# Patient Record
Sex: Male | Born: 1970 | Race: Black or African American | Hispanic: No | Marital: Married | Smoking: Former smoker
Health system: Southern US, Community
[De-identification: ages and names within clinical notes are randomized; demographics above are authoritative.]

## PROBLEM LIST (undated history)

## (undated) DIAGNOSIS — I509 Heart failure, unspecified: Secondary | ICD-10-CM

## (undated) DIAGNOSIS — I10 Essential (primary) hypertension: Secondary | ICD-10-CM

## (undated) HISTORY — DX: Essential (primary) hypertension: I10

---

## 2010-07-23 ENCOUNTER — Ambulatory Visit: Payer: Self-pay | Admitting: Cardiology

## 2010-07-23 ENCOUNTER — Observation Stay (HOSPITAL_COMMUNITY): Admission: EM | Admit: 2010-07-23 | Discharge: 2010-07-24 | Payer: Self-pay | Admitting: Emergency Medicine

## 2010-07-24 ENCOUNTER — Encounter (INDEPENDENT_AMBULATORY_CARE_PROVIDER_SITE_OTHER): Payer: Self-pay | Admitting: Emergency Medicine

## 2010-11-24 ENCOUNTER — Emergency Department (HOSPITAL_COMMUNITY): Payer: Self-pay

## 2010-11-24 ENCOUNTER — Emergency Department (HOSPITAL_COMMUNITY)
Admission: EM | Admit: 2010-11-24 | Discharge: 2010-11-24 | Disposition: A | Discharge status: Home / Self Care | Payer: Self-pay | Attending: Emergency Medicine | Admitting: Emergency Medicine

## 2010-11-24 DIAGNOSIS — R05 Cough: Secondary | ICD-10-CM | POA: Insufficient documentation

## 2010-11-24 DIAGNOSIS — J40 Bronchitis, not specified as acute or chronic: Secondary | ICD-10-CM | POA: Insufficient documentation

## 2010-11-24 DIAGNOSIS — I10 Essential (primary) hypertension: Secondary | ICD-10-CM | POA: Insufficient documentation

## 2010-11-24 DIAGNOSIS — R062 Wheezing: Secondary | ICD-10-CM | POA: Insufficient documentation

## 2010-11-24 DIAGNOSIS — R0602 Shortness of breath: Secondary | ICD-10-CM | POA: Insufficient documentation

## 2010-11-24 DIAGNOSIS — R059 Cough, unspecified: Secondary | ICD-10-CM | POA: Insufficient documentation

## 2011-01-02 LAB — BASIC METABOLIC PANEL
CO2: 24 mEq/L (ref 19–32)
Chloride: 105 mEq/L (ref 96–112)
GFR calc Af Amer: >60 mL/min (ref >60)
Potassium: 4 mEq/L (ref 3.5–5.1)

## 2011-01-02 LAB — DIFFERENTIAL
Eosinophils Relative: 3 % (ref 0–5)
Lymphocytes Relative: 40 % (ref 12–46)
Lymphs Abs: 2.8 10*3/uL (ref 0.7–4.0)
Monocytes Absolute: 0.5 10*3/uL (ref 0.1–1.0)
Monocytes Relative: 7 % (ref 3–12)

## 2011-01-02 LAB — CBC
HCT: 43.6 % (ref 39.0–52.0)
MCH: 30.8 pg (ref 26.0–34.0)
MCV: 90.6 fL (ref 78.0–100.0)
RBC: 4.81 MIL/uL (ref 4.22–5.81)
WBC: 7 10*3/uL (ref 4.0–10.5)

## 2011-01-02 LAB — POCT CARDIAC MARKERS
CKMB, poc: <1 ng/mL — ABNORMAL LOW (ref 1.0–8.0)
Myoglobin, poc: 90.5 ng/mL (ref 12–200)
Troponin i, poc: <0.05 ng/mL (ref 0.00–0.09)
Troponin i, poc: <0.05 ng/mL (ref 0.00–0.09)
Troponin i, poc: <0.05 ng/mL (ref 0.00–0.09)

## 2011-11-18 ENCOUNTER — Ambulatory Visit (INDEPENDENT_AMBULATORY_CARE_PROVIDER_SITE_OTHER): Payer: Self-pay | Admitting: Family Medicine

## 2011-11-18 VITALS — BP 162/106 | HR 84 | Temp 98.0°F | Resp 18 | Ht 69.75 in | Wt 214.0 lb

## 2011-11-18 DIAGNOSIS — I10 Essential (primary) hypertension: Secondary | ICD-10-CM

## 2011-11-18 DIAGNOSIS — K0889 Other specified disorders of teeth and supporting structures: Secondary | ICD-10-CM

## 2011-11-18 DIAGNOSIS — K089 Disorder of teeth and supporting structures, unspecified: Secondary | ICD-10-CM

## 2011-11-18 DIAGNOSIS — K047 Periapical abscess without sinus: Secondary | ICD-10-CM

## 2011-11-18 DIAGNOSIS — K029 Dental caries, unspecified: Secondary | ICD-10-CM

## 2011-11-18 LAB — POCT CBC
Granulocyte percent: 71.5
HCT, POC: 48.8
Hemoglobin: 15.9
MID (cbc): 5.8 %M — AB (ref 0–0.9)
RBC: 5.21
RDW, POC: 15.3 % — AB (ref 11.6–14.8)

## 2011-11-18 MED ORDER — AMOXICILLIN 875 MG PO TABS: 875.0000 mg | ORAL_TABLET | Freq: Two times a day (BID) | ORAL | Status: AC

## 2011-11-18 MED ORDER — HYDROCODONE-ACETAMINOPHEN 5-325 MG PO TABS: 1.0000 | ORAL_TABLET | Freq: Four times a day (QID) | ORAL | Status: AC | PRN

## 2011-11-18 NOTE — Progress Notes (Signed)
Subjective:     Patient ID: Jeffrey Murray, male   DOB: 18-Sep-1971, 41 y.o.   MRN: [id number]  HPI   Primary Physician: No primary provider on file.  Chief Complaint: Pain in mouth  41 y.o. y/o [place] American male with history noted below presents with complaint of 3 day h/o pain right lower tooth. Patient complains of swelling and pain right first molar. Tooth has had previous cavity filled when the patient was a teenager. Noted swelling of the right cheek. Afebrile/ no chills. No drainage or discharge from tooth or gum. No difficulty speaking or swallowing. No erythema of cheek.  Does not currently have a dentist. No formal dental evaluation.     PMH: HTN - Followed by the [facility] ER.  Prior to Admission medications   Medication Sig Start Date End Date Taking? Authorizing Provider  benazepril-hydrochlorthiazide (LOTENSIN HCT) 20-25 MG per tablet Take 1 tablet by mouth daily.    Historical Provider, MD  cloNIDine (CATAPRES) 0.2 MG tablet Take 0.2 mg by mouth 2 (two) times daily.    Historical Provider, MD  metoprolol tartrate (LOPRESSOR) 25 MG tablet Take 25 mg by mouth 2 (two) times daily.    Historical Provider, MD    No Known Allergies  History   Social History  . Marital Status: Married    Spouse Name: N/A    Number of Children: N/A  . Years of Education: N/A   Social History Main Topics  . Smoking status: Current Some Day Smoker  . Smokeless tobacco: None  . Alcohol Use: None  . Drug Use: None  . Sexually Active: None   Other Topics Concern  . None   Social History Narrative  . None    No family history on file.    Review of Systems  Constitutional: Negative for fever, chills, activity change, appetite change, fatigue and unexpected weight change.  HENT: Positive for facial swelling, mouth sores and dental problem. Negative for sore throat, drooling, trouble swallowing, neck pain, neck stiffness, voice change and postnasal drip.   Respiratory:  Negative for cough.   Cardiovascular: Negative for chest pain.       Objective:   Physical Exam  Vitals reviewed. Constitutional: He is oriented to person, place, and time. He appears well-developed and well-nourished. No distress.  HENT:  Head: Normocephalic and atraumatic.  Right Ear: External ear normal.  Left Ear: External ear normal.  Mouth/Throat: Oropharynx is clear and moist and mucous membranes are normal. He does not have dentures. No oral lesions. Abnormal dentition. Dental caries present. No dental abscesses, uvula swelling or lacerations.    Eyes: Conjunctivae and EOM are normal. Pupils are equal, round, and reactive to light.  Neck: Normal range of motion. Neck supple. No tracheal deviation present. No thyromegaly present.  Cardiovascular: Normal rate, regular rhythm and normal heart sounds.  Exam reveals no gallop and no friction rub.   No murmur heard. Pulmonary/Chest: Effort normal and breath sounds normal. No stridor. No respiratory distress. He has no wheezes. He has no rales. He exhibits no tenderness.  Lymphadenopathy:    He has no cervical adenopathy.  Neurological: He is alert and oriented to person, place, and time.  Skin: Skin is warm and dry. He is not diaphoretic. No erythema.  Psychiatric: He has a normal mood and affect. His behavior is normal. Judgment and thought content normal.    CBC    Component Value Date/Time   WBC 9.3 11/18/2011 1311   WBC 7.0 07/23/2010 1525  RBC 5.21 11/18/2011 1311   RBC 4.81 07/23/2010 1525   HGB 15.9 11/18/2011 1311   HGB 14.8 07/23/2010 1525   HCT 48.8 11/18/2011 1311   HCT 43.6 07/23/2010 1525   PLT 239 07/23/2010 1525   MCV 93.7 11/18/2011 1311   MCV 90.6 07/23/2010 1525   MCH 30.5 11/18/2011 1311   MCH 30.8 07/23/2010 1525   MCHC 32.6 11/18/2011 1311   MCHC 33.9 07/23/2010 1525   RDW 14.4 07/23/2010 1525   LYMPHSABS 2.8 07/23/2010 1525   MONOABS 0.5 07/23/2010 1525   EOSABS 0.2 07/23/2010 1525   BASOSABS 0.0 07/23/2010 1525         Assessment:     41 y.o. African American male with early dental abscess and tooth pain.    Plan:     1. Dental referral. Patient given names and phones numbers of local dentists with instructions to call for an appointment. If he cannot get an appointment within 24 hours or if he worsens he is to go to the ER or RTC for evaluation and treatment.  2. Amoxicillin 875 mg 1 po bid # 20 No RF 3. Norco 5/325 mg 1 po q 6 hours prn pain # 30 No RF SED 4. Recommend patient to take antihypertensives as directed and acquire PCP for follow up and management of his HTN. Follow up 1-2 days for BP. 5. RTC and ER precautions 6. Patient seen by and discussed with Dr. Alwyn Ren.     Patient was seen by me with the the physician assistant. Agree with exam and assessment and treatment.

## 2011-11-18 NOTE — Patient Instructions (Addendum)
Dental Pain  Please call a dentist today for evaluation and treatment of your tooth. If you cannot get into a dentist please come back for evaluation.  A tooth ache may be caused by cavities (tooth decay). Cavities expose the nerve of the tooth to air and hot or cold temperatures. It may come from an infection or abscess (also called a boil or furuncle) around your tooth. It is also often caused by dental caries (tooth decay). This causes the pain you are having. DIAGNOSIS  Your caregiver can diagnose this problem by exam. TREATMENT   If caused by an infection, it may be treated with medications which kill germs (antibiotics) and pain medications as prescribed by your caregiver. Take medications as directed.   Only take over-the-counter or prescription medicines for pain, discomfort, or fever as directed by your caregiver.   Whether the tooth ache today is caused by infection or dental disease, you should see your dentist as soon as possible for further care.  SEEK MEDICAL CARE IF: The exam and treatment you received today has been provided on an emergency basis only. This is not a substitute for complete medical or dental care. If your problem worsens or new problems (symptoms) appear, and you are unable to meet with your dentist, call or return to this location. SEEK IMMEDIATE MEDICAL CARE IF:   You have a fever.   You develop redness and swelling of your face, jaw, or neck.   You are unable to open your mouth.   You have severe pain uncontrolled by pain medicine.  MAKE SURE YOU:   Understand these instructions.   Will watch your condition.   Will get help right away if you are not doing well or get worse.  Document Released: 10/06/2005 Document Revised: 06/18/2011 Document Reviewed: 05/24/2008 [facility] Patient Information 2012 [place], [place].

## 2011-11-19 ENCOUNTER — Telehealth: Payer: Self-pay

## 2011-11-19 NOTE — Telephone Encounter (Signed)
Patient states that the antibiotics and pain medication that he was given yesterday are not helping with the pain and swelling. Please advise.

## 2012-02-11 ENCOUNTER — Emergency Department (HOSPITAL_COMMUNITY): Payer: Medicaid Other

## 2012-02-11 ENCOUNTER — Encounter (HOSPITAL_COMMUNITY): Payer: Self-pay | Admitting: *Deleted

## 2012-02-11 ENCOUNTER — Emergency Department (HOSPITAL_COMMUNITY)
Admission: EM | Admit: 2012-02-11 | Discharge: 2012-02-11 | Disposition: A | Discharge status: Home / Self Care | Payer: Medicaid Other | Attending: Emergency Medicine | Admitting: Emergency Medicine

## 2012-02-11 DIAGNOSIS — F17200 Nicotine dependence, unspecified, uncomplicated: Secondary | ICD-10-CM | POA: Insufficient documentation

## 2012-02-11 DIAGNOSIS — R05 Cough: Secondary | ICD-10-CM | POA: Insufficient documentation

## 2012-02-11 DIAGNOSIS — N289 Disorder of kidney and ureter, unspecified: Secondary | ICD-10-CM | POA: Insufficient documentation

## 2012-02-11 DIAGNOSIS — R059 Cough, unspecified: Secondary | ICD-10-CM | POA: Insufficient documentation

## 2012-02-11 DIAGNOSIS — I509 Heart failure, unspecified: Secondary | ICD-10-CM

## 2012-02-11 DIAGNOSIS — I10 Essential (primary) hypertension: Secondary | ICD-10-CM | POA: Insufficient documentation

## 2012-02-11 DIAGNOSIS — R0602 Shortness of breath: Secondary | ICD-10-CM | POA: Insufficient documentation

## 2012-02-11 LAB — COMPREHENSIVE METABOLIC PANEL
ALT: 41 U/L (ref 0–53)
AST: 41 U/L — ABNORMAL HIGH (ref 0–37)
CO2: 26 mEq/L (ref 19–32)
Calcium: 9.2 mg/dL (ref 8.4–10.5)
GFR calc non Af Amer: 55 mL/min — ABNORMAL LOW (ref >90)
Sodium: 139 mEq/L (ref 135–145)

## 2012-02-11 LAB — PRO B NATRIURETIC PEPTIDE: Pro B Natriuretic peptide (BNP): 3701 pg/mL — ABNORMAL HIGH (ref 0–125)

## 2012-02-11 MED ORDER — FUROSEMIDE 40 MG PO TABS: 40.0000 mg | ORAL_TABLET | Freq: Every day | ORAL | Status: DC

## 2012-02-11 MED ORDER — FUROSEMIDE 20 MG PO TABS
40.0000 mg | ORAL_TABLET | Freq: Once | ORAL | Status: AC
  Administered 2012-02-11: 40 mg via ORAL
  Filled 2012-02-11: qty 2

## 2012-02-11 NOTE — Discharge Instructions (Signed)
Stop smoking.  Your x-ray shows fluid in your lungs, but no pneumonia.  Your oxygen level is normal.  Use Lasix to remove fluid from your lungs and decrease in her shortness of breath.  Followup with your Dr. for reevaluation and to discuss stopping 1 of your other blood pressure medicines since.  I have added a new one.  Return for worse or uncontrolled symptoms

## 2012-02-11 NOTE — ED Provider Notes (Signed)
History     CSN: [id number]  Arrival date & time 02/11/12  4098   First MD Initiated Contact with Patient 02/11/12 1103      Chief Complaint  Patient presents with  . Shortness of Breath    (Consider location/radiation/quality/duration/timing/severity/associated sxs/prior treatment) Patient is a 41 y.o. male presenting with shortness of breath. The history is provided by the patient.  Shortness of Breath  Associated symptoms include cough and shortness of breath. Pertinent negatives include no chest pain and no fever.   the patient is a 41 year old, male, who has hypertension, and smokes about one pack per week.  He presents emergency department complaining of chest congestion and nonproductive cough, dyspnea on exertion, and orthopnea.  He denies chest pain.  He denies nausea, vomiting, fevers, chills, sweating.  He denies leg pain or swelling.  He says it is hard to for him to sleep because  he wakes up short of breath, with a cough.  Past Medical History  Diagnosis Date  . HTN (hypertension)     not taking medications daily    History reviewed. No pertinent past surgical history.  No family history on file.  History  Substance Use Topics  . Smoking status: Current Some Day Smoker  . Smokeless tobacco: Not on file  . Alcohol Use: Yes      Review of Systems  Constitutional: Negative for fever and chills.  HENT: Positive for congestion.   Respiratory: Positive for cough and shortness of breath. Negative for chest tightness.   Cardiovascular: Negative for chest pain and leg swelling.  Gastrointestinal: Negative for nausea and vomiting.  Neurological: Negative for headaches.  All other systems reviewed and are negative.    Allergies  Review of patient's allergies indicates no known allergies.  Home Medications   Current Outpatient Rx  Name Route Sig Dispense Refill  . BENAZEPRIL-HYDROCHLOROTHIAZIDE 20-25 MG PO TABS Oral Take 1 tablet by mouth daily.    Marland Kitchen  CLONIDINE HCL 0.2 MG PO TABS Oral Take 0.2 mg by mouth 2 (two) times daily.    Marland Kitchen METOPROLOL TARTRATE 25 MG PO TABS Oral Take 25 mg by mouth 2 (two) times daily.      BP 170/134  Pulse 87  Temp(Src) 97.7 F (36.5 C) (Oral)  Resp 20  Ht 5\' 10"  (1.778 m)  Wt 220 lb (99.791 kg)  BMI 31.57 kg/m2  SpO2 98%  Physical Exam  Vitals reviewed. Constitutional: He is oriented to person, place, and time. He appears well-developed and well-nourished. No distress.  HENT:  Head: Normocephalic and atraumatic.  Eyes: Conjunctivae are normal.  Neck: Normal range of motion. Neck supple.  Cardiovascular: Normal rate.   No murmur heard. Pulmonary/Chest: No respiratory distress. He has no wheezes. He has no rales.       Bilateral rhonchi  Abdominal: Soft. He exhibits no distension.  Musculoskeletal: Normal range of motion.  Neurological: He is alert and oriented to person, place, and time.  Skin: Skin is warm and dry.  Psychiatric: He has a normal mood and affect. Thought content normal.    ED Course  Procedures (including critical care time) 41 year old, male, with symptoms consistent with heart failure, including nonproductive cough, shortness of breath, dyspnea on exertion, and orthopnea.  He is in no distress.  Chest x-ray shows interstitial edema and he got rhonchi on exam.  He is hypertensive with room air saturation of 98%.  We'll give Lasix in ED and as prescription.  He can follow up with his  Dr. for further evaluation  Labs Reviewed  COMPREHENSIVE METABOLIC PANEL - Abnormal; Notable for the following:    Glucose, Bld 126 (*)    Creatinine, Ser 1.51 (*)    Albumin 3.3 (*)    AST 41 (*)    GFR calc non Af Amer 55 (*)    GFR calc Af Amer 64 (*)    All other components within normal limits  PRO B NATRIURETIC PEPTIDE   Dg Chest 2 View  02/11/2012  *RADIOLOGY REPORT*  Clinical Data: Short of breath.  Weakness.  CHEST - 2 VIEW  Comparison: 11/24/2010  Findings: The heart is enlarged.   Mediastinal shadows are normal. There is venous hypertension with early interstitial edema.  No measurable effusion.  Tiny amount of fluid in the fissures.  No focal lesion.  No significant bony finding.  IMPRESSION: Cardiomegaly.  Pulmonary venous hypertension with early interstitial edema.  Original Report Authenticated By: Thomasenia Sales, M.D.     No diagnosis found.    MDM  Mild CHF, and mild renal insufficiency. No pneumonia, hypoxia, respiratory distress.        Cheri Guppy, MD 02/11/12 1124

## 2012-02-11 NOTE — ED Notes (Signed)
Patient reports when he goes to sleep,  He wakes up not breathing and he has sob.  Patient states he feels like there is something in his chest making him cough.  Patient states when he walks he has sob.

## 2012-03-19 ENCOUNTER — Emergency Department (HOSPITAL_COMMUNITY): Payer: Self-pay

## 2012-03-19 ENCOUNTER — Encounter (HOSPITAL_COMMUNITY): Payer: Self-pay | Admitting: Emergency Medicine

## 2012-03-19 ENCOUNTER — Emergency Department (HOSPITAL_COMMUNITY)
Admission: EM | Admit: 2012-03-19 | Discharge: 2012-03-19 | Disposition: A | Discharge status: Home / Self Care | Payer: Self-pay | Attending: Emergency Medicine | Admitting: Emergency Medicine

## 2012-03-19 DIAGNOSIS — R609 Edema, unspecified: Secondary | ICD-10-CM | POA: Insufficient documentation

## 2012-03-19 DIAGNOSIS — R0989 Other specified symptoms and signs involving the circulatory and respiratory systems: Secondary | ICD-10-CM

## 2012-03-19 DIAGNOSIS — J811 Chronic pulmonary edema: Secondary | ICD-10-CM | POA: Insufficient documentation

## 2012-03-19 DIAGNOSIS — R0602 Shortness of breath: Secondary | ICD-10-CM | POA: Insufficient documentation

## 2012-03-19 DIAGNOSIS — I10 Essential (primary) hypertension: Secondary | ICD-10-CM

## 2012-03-19 DIAGNOSIS — R600 Localized edema: Secondary | ICD-10-CM

## 2012-03-19 LAB — COMPREHENSIVE METABOLIC PANEL
Albumin: 3 g/dL — ABNORMAL LOW (ref 3.5–5.2)
BUN: 13 mg/dL (ref 6–23)
Creatinine, Ser: 1.42 mg/dL — ABNORMAL HIGH (ref 0.50–1.35)
Total Protein: 6 g/dL (ref 6.0–8.3)

## 2012-03-19 LAB — DIFFERENTIAL
Basophils Relative: 1 % (ref 0–1)
Eosinophils Absolute: 0.3 10*3/uL (ref 0.0–0.7)
Eosinophils Relative: 4 % (ref 0–5)
Monocytes Absolute: 0.4 10*3/uL (ref 0.1–1.0)
Monocytes Relative: 6 % (ref 3–12)

## 2012-03-19 LAB — CBC
HCT: 45.2 % (ref 39.0–52.0)
Hemoglobin: 15.3 g/dL (ref 13.0–17.0)
MCH: 31.2 pg (ref 26.0–34.0)
MCHC: 33.8 g/dL (ref 30.0–36.0)

## 2012-03-19 LAB — PRO B NATRIURETIC PEPTIDE: Pro B Natriuretic peptide (BNP): 2723 pg/mL — ABNORMAL HIGH (ref 0–125)

## 2012-03-19 MED ORDER — HYDROCODONE-ACETAMINOPHEN 5-500 MG PO TABS: 1.0000 | ORAL_TABLET | Freq: Four times a day (QID) | ORAL | Status: AC | PRN

## 2012-03-19 MED ORDER — CLONIDINE HCL 0.1 MG PO TABS
0.2000 mg | ORAL_TABLET | Freq: Once | ORAL | Status: AC
  Administered 2012-03-19: 0.2 mg via ORAL
  Filled 2012-03-19: qty 2

## 2012-03-19 MED ORDER — FUROSEMIDE 10 MG/ML IJ SOLN
40.0000 mg | Freq: Once | INTRAMUSCULAR | Status: AC
  Administered 2012-03-19: 40 mg via INTRAVENOUS
  Filled 2012-03-19: qty 4

## 2012-03-19 MED ORDER — FUROSEMIDE 40 MG PO TABS: 40.0000 mg | ORAL_TABLET | Freq: Every day | ORAL | Status: DC

## 2012-03-19 MED ORDER — HYDROCODONE-ACETAMINOPHEN 5-325 MG PO TABS
1.0000 | ORAL_TABLET | Freq: Once | ORAL | Status: AC
  Administered 2012-03-19: 1 via ORAL
  Filled 2012-03-19: qty 1

## 2012-03-19 MED ORDER — CLONIDINE HCL 0.2 MG PO TABS: 0.2000 mg | ORAL_TABLET | Freq: Two times a day (BID) | ORAL | Status: DC

## 2012-03-19 NOTE — ED Provider Notes (Signed)
History     CSN: [id number]  Arrival date & time 03/19/12  1022   First MD Initiated Contact with Patient 03/19/12 1044      Chief Complaint  Patient presents with  . Leg Swelling    (Consider location/radiation/quality/duration/timing/severity/associated sxs/prior treatment) HPI Comments: Pt is a 41yo male that presents with complaint of LE swelling. States swelling started about 2 wks ago. States has never been this severe. Admits to pain as well. States ran out of lasix 2 days ago. Also out of his blood pressure medications. States he does not have a primary care doctor. Reports progressive shorness of breath as well, worse with laying flat and exertion. States he was here about a month ago and was told he had some fluid on his lungs, and was sent home with fluid pills. Pt denies fever, chills, chest pain, abdominal pain. No pain in his calfs. No recent travel or surgeries.  The history is provided by the patient.    Past Medical History  Diagnosis Date  . HTN (hypertension)     not taking medications daily    History reviewed. No pertinent past surgical history.  No family history on file.  History  Substance Use Topics  . Smoking status: Current Some Day Smoker  . Smokeless tobacco: Not on file  . Alcohol Use: Yes      Review of Systems  Constitutional: Negative for fever, chills and diaphoresis.  HENT: Negative.   Respiratory: Positive for shortness of breath. Negative for cough, chest tightness and wheezing.   Cardiovascular: Positive for leg swelling. Negative for chest pain and palpitations.  Gastrointestinal: Negative for abdominal pain.  Genitourinary: Negative for dysuria.  Musculoskeletal: Negative.   Skin: Negative.   Neurological: Negative for dizziness, weakness, light-headedness and headaches.    Allergies  Review of patient's allergies indicates no known allergies.  Home Medications   Current Outpatient Rx  Name Route Sig Dispense Refill  .  FUROSEMIDE 40 MG PO TABS Oral Take 1 tablet (40 mg total) by mouth daily. 30 tablet 0    BP 149/118  Pulse 104  Temp(Src) 98.2 F (36.8 C) (Oral)  Resp 14  SpO2 96%  Physical Exam  Nursing note and vitals reviewed. Constitutional: He is oriented to person, place, and time. He appears well-developed and well-nourished. No distress.  HENT:  Head: Normocephalic.  Eyes: Conjunctivae are normal.  Neck: Neck supple.  Cardiovascular: Normal rate, regular rhythm and normal heart sounds.   Pulmonary/Chest: Effort normal. No respiratory distress. He has no wheezes. He has rales.       Rales at bases bilaterally  Abdominal: Soft. Bowel sounds are normal. He exhibits no distension. There is no tenderness.  Musculoskeletal: He exhibits edema.       3+ bilateral pitting lower extremity edema up to mid shin. Calfs non tender bilaterally. Negative homans's sign.  Neurological: He is alert and oriented to person, place, and time.  Skin: Skin is warm and dry.  Psychiatric: He has a normal mood and affect.    ED Course  Procedures (including critical care time)  Pt with increased bilateral lower leg edema. Last CXR showed cardioemgaly with vascular congestion, Suspect CHF? Will get labs, BNP, CXR. Lasix ordered.   Results for orders placed during the hospital encounter of 03/19/12  CBC      Component Value Range   WBC 6.5  4.0 - 10.5 (K/uL)   RBC 4.91  4.22 - 5.81 (MIL/uL)   Hemoglobin 15.3  13.0 -  17.0 (g/dL)   HCT 16.1  09.6 - 04.5 (%)   MCV 92.1  78.0 - 100.0 (fL)   MCH 31.2  26.0 - 34.0 (pg)   MCHC 33.8  30.0 - 36.0 (g/dL)   RDW 40.9  81.1 - 91.4 (%)   Platelets 240  150 - 400 (K/uL)  DIFFERENTIAL      Component Value Range   Neutrophils Relative 49  43 - 77 (%)   Neutro Abs 3.2  1.7 - 7.7 (K/uL)   Lymphocytes Relative 40  12 - 46 (%)   Lymphs Abs 2.6  0.7 - 4.0 (K/uL)   Monocytes Relative 6  3 - 12 (%)   Monocytes Absolute 0.4  0.1 - 1.0 (K/uL)   Eosinophils Relative 4  0 - 5  (%)   Eosinophils Absolute 0.3  0.0 - 0.7 (K/uL)   Basophils Relative 1  0 - 1 (%)   Basophils Absolute 0.1  0.0 - 0.1 (K/uL)  COMPREHENSIVE METABOLIC PANEL      Component Value Range   Sodium 138  135 - 145 (mEq/L)   Potassium 3.9  3.5 - 5.1 (mEq/L)   Chloride 104  96 - 112 (mEq/L)   CO2 22  19 - 32 (mEq/L)   Glucose, Bld 108 (*) 70 - 99 (mg/dL)   BUN 13  6 - 23 (mg/dL)   Creatinine, Ser 7.82 (*) 0.50 - 1.35 (mg/dL)   Calcium 8.9  8.4 - 95.6 (mg/dL)   Total Protein 6.0  6.0 - 8.3 (g/dL)   Albumin 3.0 (*) 3.5 - 5.2 (g/dL)   AST 48 (*) 0 - 37 (U/L)   ALT 48  0 - 53 (U/L)   Alkaline Phosphatase 158 (*) 39 - 117 (U/L)   Total Bilirubin 0.6  0.3 - 1.2 (mg/dL)   GFR calc non Af Amer 60 (*) >90 (mL/min)   GFR calc Af Amer 69 (*) >90 (mL/min)  PRO B NATRIURETIC PEPTIDE      Component Value Range   Pro B Natriuretic peptide (BNP) 2723.0 (*) 0 - 125 (pg/mL)   Dg Chest 2 View  03/19/2012  *RADIOLOGY REPORT*  Clinical Data: 41 year old male with shortness of breath and lower extremity swelling.  CHEST - 2 VIEW  Comparison: 02/11/2012 and earlier.  Findings: Stable cardiomegaly and mediastinal contours.  Trace fluid in the pleural fissures and continued vascular congestion.  No overt edema.  No pneumothorax or consolidation. Visualized tracheal air column is within normal limits.  No acute osseous abnormality.  IMPRESSION: Stable cardiomegaly and vascular congestion without overt pulmonary edema.  Original Report Authenticated By: Harley Hallmark, M.D.   1:01 PM CXR shows stable cardiomegaly and vascular congestion without pulmonary edema. VS normal. Labs without major findings. Discussed with pt about possibility of admission for diuresis, he chose to go home with prescription for lasix and refill on his clonidine for HTN. Pt was seen by Child psychotherapist, given resources for primary care doctor.   1. Bilateral lower extremity edema   2. Pulmonary vascular congestion   3. Hypertension        MDM         Lottie Mussel, PA 03/19/12 [phone]

## 2012-03-19 NOTE — ED Notes (Signed)
Onset 2 weeks ago lower extremity increasing overtime. Pain 7/10 achy throbbing pain.

## 2012-03-19 NOTE — Discharge Instructions (Signed)
You have extra fluid on your lungs and your legs. This may be related to your heart, it is very important that you follow up with your doctor as soon as able. Take lasix (fluid pill) as prescribed. Take clonidine for your blood pressure. Keep legs elevated. Watch your diet, avoid foods high in sodium. Follow up. Return if worsening.   Edema Edema is an abnormal build-up of fluids in tissues. Because this is partly dependent on gravity (water flows to the lowest place), it is more common in the leg sand thighs (lower extremities). It is also common in the looser tissues, like around the eyes. Painless swelling of the feet and ankles is common and increases as a person ages. It may affect both legs and may include the calves or even thighs. When squeezed, the fluid may move out of the affected area and may leave a dent for a few moments. CAUSES   Prolonged standing or sitting in one place for extended periods of time. Movement helps pump tissue fluid into the veins, and absence of movement prevents this, resulting in edema.   Varicose veins. The valves in the veins do not work as well as they should. This causes fluid to leak into the tissues.   Fluid and salt overload.   Injury, burn, or surgery to the leg, ankle, or foot, may damage veins and allow fluid to leak out.   Sunburn damages vessels. Leaky vessels allow fluid to go out into the sunburned tissues.   Allergies (from insect bites or stings, medications or chemicals) cause swelling by allowing vessels to become leaky.   Protein in the blood helps keep fluid in your vessels. Low protein, as in malnutrition, allows fluid to leak out.   Hormonal changes, including pregnancy and menstruation, cause fluid retention. This fluid may leak out of vessels and cause edema.   Medications that cause fluid retention. Examples are sex hormones, blood pressure medications, steroid treatment, or anti-depressants.   Some illnesses cause edema, especially  heart failure, kidney disease, or liver disease.   Surgery that cuts veins or lymph nodes, such as surgery done for the heart or for breast cancer, may result in edema.  DIAGNOSIS  Your caregiver is usually easily able to determine what is causing your swelling (edema) by simply asking what is wrong (getting a history) and examining you (doing a physical). Sometimes x-rays, EKG (electrocardiogram or heart tracing), and blood work may be done to evaluate for underlying medical illness. TREATMENT  General treatment includes:  Leg elevation (or elevation of the affected body part).   Restriction of fluid intake.   Prevention of fluid overload.   Compression of the affected body part. Compression with elastic bandages or support stockings squeezes the tissues, preventing fluid from entering and forcing it back into the blood vessels.   Diuretics (also called water pills or fluid pills) pull fluid out of your body in the form of increased urination. These are effective in reducing the swelling, but can have side effects and must be used only under your caregiver's supervision. Diuretics are appropriate only for some types of edema.  The specific treatment can be directed at any underlying causes discovered. Heart, liver, or kidney disease should be treated appropriately. HOME CARE INSTRUCTIONS   Elevate the legs (or affected body part) above the level of the heart, while lying down.   Avoid sitting or standing still for prolonged periods of time.   Avoid putting anything directly under the knees when lying  down, and do not wear constricting clothing or garters on the upper legs.   Exercising the legs causes the fluid to work back into the veins and lymphatic channels. This may help the swelling go down.   The pressure applied by elastic bandages or support stockings can help reduce ankle swelling.   A low-salt diet may help reduce fluid retention and decrease the ankle swelling.   Take any  medications exactly as prescribed.  SEEK MEDICAL CARE IF:  Your edema is not responding to recommended treatments. SEEK IMMEDIATE MEDICAL CARE IF:   You develop shortness of breath or chest pain.   You cannot breathe when you lay down; or if, while lying down, you have to get up and go to the window to get your breath.   You are having increasing swelling without relief from treatment.   You develop a fever over 102 F (38.9 C).   You develop pain or redness in the areas that are swollen.   Tell your caregiver right away if you have gained 3 lb/1.4 kg in 1 day or 5 lb/2.3 kg in a week.  MAKE SURE YOU:   Understand these instructions.   Will watch your condition.   Will get help right away if you are not doing well or get worse.  Document Released: 10/06/2005 Document Revised: 09/25/2011 Document Reviewed: 05/24/2008 [facility] Patient Information 2012 [place], [place].

## 2012-03-19 NOTE — ED Provider Notes (Signed)
Medical screening examination/treatment/procedure(s) were performed by non-physician practitioner and as supervising physician I was immediately available for consultation/collaboration.  Juliet Rude. Rubin Payor, MD 03/19/12 1601

## 2012-03-19 NOTE — ED Notes (Signed)
Patient transported to X-ray 

## 2012-04-11 ENCOUNTER — Emergency Department (HOSPITAL_COMMUNITY): Payer: Medicaid Other

## 2012-04-11 ENCOUNTER — Encounter (HOSPITAL_COMMUNITY): Payer: Self-pay

## 2012-04-11 ENCOUNTER — Inpatient Hospital Stay (HOSPITAL_COMMUNITY)
Admission: EM | Admit: 2012-04-11 | Discharge: 2012-04-20 | DRG: 286 | Disposition: A | Discharge status: Home / Self Care | Payer: Medicaid Other | Source: Ambulatory Visit | Attending: Internal Medicine | Admitting: Internal Medicine

## 2012-04-11 DIAGNOSIS — I428 Other cardiomyopathies: Secondary | ICD-10-CM | POA: Diagnosis present

## 2012-04-11 DIAGNOSIS — I10 Essential (primary) hypertension: Secondary | ICD-10-CM

## 2012-04-11 DIAGNOSIS — I509 Heart failure, unspecified: Secondary | ICD-10-CM

## 2012-04-11 DIAGNOSIS — N189 Chronic kidney disease, unspecified: Secondary | ICD-10-CM | POA: Diagnosis present

## 2012-04-11 DIAGNOSIS — R570 Cardiogenic shock: Secondary | ICD-10-CM | POA: Diagnosis present

## 2012-04-11 DIAGNOSIS — Z87891 Personal history of nicotine dependence: Secondary | ICD-10-CM

## 2012-04-11 DIAGNOSIS — J189 Pneumonia, unspecified organism: Secondary | ICD-10-CM

## 2012-04-11 DIAGNOSIS — Z91199 Patient's noncompliance with other medical treatment and regimen due to unspecified reason: Secondary | ICD-10-CM

## 2012-04-11 DIAGNOSIS — I4949 Other premature depolarization: Secondary | ICD-10-CM | POA: Diagnosis present

## 2012-04-11 DIAGNOSIS — I119 Hypertensive heart disease without heart failure: Secondary | ICD-10-CM

## 2012-04-11 DIAGNOSIS — R17 Unspecified jaundice: Secondary | ICD-10-CM | POA: Diagnosis present

## 2012-04-11 DIAGNOSIS — I517 Cardiomegaly: Secondary | ICD-10-CM | POA: Diagnosis present

## 2012-04-11 DIAGNOSIS — I130 Hypertensive heart and chronic kidney disease with heart failure and stage 1 through stage 4 chronic kidney disease, or unspecified chronic kidney disease: Principal | ICD-10-CM | POA: Diagnosis present

## 2012-04-11 DIAGNOSIS — I5021 Acute systolic (congestive) heart failure: Secondary | ICD-10-CM | POA: Diagnosis present

## 2012-04-11 DIAGNOSIS — E876 Hypokalemia: Secondary | ICD-10-CM | POA: Diagnosis not present

## 2012-04-11 DIAGNOSIS — Z9119 Patient's noncompliance with other medical treatment and regimen: Secondary | ICD-10-CM

## 2012-04-11 HISTORY — DX: Heart failure, unspecified: I50.9

## 2012-04-11 LAB — CARDIAC PANEL(CRET KIN+CKTOT+MB+TROPI)
Relative Index: INVALID (ref 0.0–2.5)
Total CK: 99 U/L (ref 7–232)
Troponin I: <0.3 ng/mL (ref <0.30)

## 2012-04-11 LAB — CBC
HCT: 47.8 % (ref 39.0–52.0)
Hemoglobin: 16.3 g/dL (ref 13.0–17.0)
MCH: 30.4 pg (ref 26.0–34.0)
MCV: 89.2 fL (ref 78.0–100.0)
Platelets: 322 10*3/uL (ref 150–400)
RBC: 5.36 MIL/uL (ref 4.22–5.81)

## 2012-04-11 LAB — DIFFERENTIAL
Eosinophils Absolute: 0 10*3/uL (ref 0.0–0.7)
Eosinophils Relative: 0 % (ref 0–5)
Lymphs Abs: 1.4 10*3/uL (ref 0.7–4.0)
Monocytes Absolute: 0.8 10*3/uL (ref 0.1–1.0)
Monocytes Relative: 8 % (ref 3–12)

## 2012-04-11 LAB — BASIC METABOLIC PANEL
BUN: 16 mg/dL (ref 6–23)
GFR calc Af Amer: 70 mL/min — ABNORMAL LOW (ref >90)
Glucose, Bld: 67 mg/dL — ABNORMAL LOW (ref 70–99)

## 2012-04-11 LAB — PRO B NATRIURETIC PEPTIDE: Pro B Natriuretic peptide (BNP): 5087 pg/mL — ABNORMAL HIGH (ref 0–125)

## 2012-04-11 MED ORDER — SODIUM CHLORIDE 0.9 % IV SOLN: 250.0000 mL | INTRAVENOUS | Status: DC | PRN

## 2012-04-11 MED ORDER — SODIUM CHLORIDE 0.9 % IJ SOLN
3.0000 mL | Freq: Two times a day (BID) | INTRAMUSCULAR | Status: DC
  Administered 2012-04-12: 3 mL via INTRAVENOUS

## 2012-04-11 MED ORDER — MORPHINE SULFATE 4 MG/ML IJ SOLN
4.0000 mg | INTRAMUSCULAR | Status: DC | PRN
  Administered 2012-04-11: 4 mg via INTRAVENOUS
  Filled 2012-04-11: qty 1

## 2012-04-11 MED ORDER — SODIUM CHLORIDE 0.9 % IJ SOLN: 3.0000 mL | INTRAMUSCULAR | Status: DC | PRN

## 2012-04-11 MED ORDER — FUROSEMIDE 10 MG/ML IJ SOLN
40.0000 mg | Freq: Once | INTRAMUSCULAR | Status: AC
  Administered 2012-04-11: 40 mg via INTRAVENOUS
  Filled 2012-04-11: qty 4

## 2012-04-11 MED ORDER — CLONIDINE HCL 0.2 MG PO TABS
0.2000 mg | ORAL_TABLET | Freq: Two times a day (BID) | ORAL | Status: DC
  Filled 2012-04-11: qty 1

## 2012-04-11 MED ORDER — NITROGLYCERIN 2 % TD OINT
1.0000 [in_us] | TOPICAL_OINTMENT | Freq: Four times a day (QID) | TRANSDERMAL | Status: DC
  Administered 2012-04-11 – 2012-04-13 (×8): 1 [in_us] via TOPICAL
  Filled 2012-04-11: qty 1

## 2012-04-11 MED ORDER — ASPIRIN EC 81 MG PO TBEC
81.0000 mg | DELAYED_RELEASE_TABLET | Freq: Every day | ORAL | Status: DC
  Administered 2012-04-11 – 2012-04-20 (×9): 81 mg via ORAL
  Filled 2012-04-11 (×10): qty 1

## 2012-04-11 MED ORDER — DEXTROSE 5 % IV SOLN
1.0000 g | Freq: Once | INTRAVENOUS | Status: AC
  Administered 2012-04-11: 1 g via INTRAVENOUS
  Filled 2012-04-11: qty 10

## 2012-04-11 MED ORDER — FUROSEMIDE 10 MG/ML IJ SOLN
80.0000 mg | Freq: Two times a day (BID) | INTRAMUSCULAR | Status: DC
  Administered 2012-04-11: 80 mg via INTRAVENOUS
  Filled 2012-04-11 (×4): qty 8

## 2012-04-11 MED ORDER — ACETAMINOPHEN 650 MG RE SUPP: 650.0000 mg | Freq: Four times a day (QID) | RECTAL | Status: DC | PRN

## 2012-04-11 MED ORDER — HEPARIN SODIUM (PORCINE) 5000 UNIT/ML IJ SOLN
5000.0000 [IU] | Freq: Three times a day (TID) | INTRAMUSCULAR | Status: DC
  Administered 2012-04-11 – 2012-04-14 (×10): 5000 [IU] via SUBCUTANEOUS
  Filled 2012-04-11 (×14): qty 1

## 2012-04-11 MED ORDER — ACETAMINOPHEN 325 MG PO TABS
650.0000 mg | ORAL_TABLET | Freq: Four times a day (QID) | ORAL | Status: DC | PRN
  Administered 2012-04-14: 650 mg via ORAL
  Filled 2012-04-11: qty 2

## 2012-04-11 MED ORDER — CLONIDINE HCL 0.2 MG PO TABS
0.2000 mg | ORAL_TABLET | Freq: Two times a day (BID) | ORAL | Status: DC
  Administered 2012-04-11 – 2012-04-12 (×3): 0.2 mg via ORAL
  Filled 2012-04-11 (×5): qty 1

## 2012-04-11 MED ORDER — HYDRALAZINE HCL 20 MG/ML IJ SOLN
5.0000 mg | INTRAMUSCULAR | Status: DC | PRN
  Filled 2012-04-11: qty 0.25

## 2012-04-11 MED ORDER — AZITHROMYCIN 500 MG IV SOLR
500.0000 mg | Freq: Once | INTRAVENOUS | Status: DC
  Filled 2012-04-11: qty 500

## 2012-04-11 MED ORDER — SODIUM CHLORIDE 0.9 % IJ SOLN
3.0000 mL | Freq: Two times a day (BID) | INTRAMUSCULAR | Status: DC
  Administered 2012-04-11 – 2012-04-13 (×2): 3 mL via INTRAVENOUS

## 2012-04-11 NOTE — ED Notes (Signed)
Gave new and old ECG to Dr. Ignacia Palma after I performed. 1:08pm JG.

## 2012-04-11 NOTE — ED Provider Notes (Signed)
Medical screening examination/treatment/procedure(s) were conducted as a shared visit with non-physician practitioner(s) and myself.  I personally evaluated the patient during the encounter Medical screening examination/treatment/procedure(s) were conducted as a shared visit with non-physician practitioner(s) and myself. I personally evaluated the patient during the encounter  41 yo man with neglected hypertension who presents with shortness of breath. Exam shows bibasilar rales. Lab workup shows L hilar pneumonia, cardiomegaly, increased vascular markings. BNP over 5000. Will need admission to treat CAP, CHF related to his hypertension.        Carleene Cooper III, MD 04/11/12 2051

## 2012-04-11 NOTE — ED Notes (Signed)
Pt presents to department for evaluation of SOB. Ongoing for several weeks. Pt states shortness of breath became worse today. Labored respirations upon arrival to exam room. States he ran out of fluid pills on Friday. Bilateral lower leg/foot edema noted. Denies chest pain. Lung sounds diminished throughout, no wheezing noted. Skin warm and dry. Pt speaking complete sentences at the time. He is conscious alert and oriented x4. Also states difficulty sleeping, states he can't lay flat.

## 2012-04-11 NOTE — ED Notes (Signed)
Pt with SOB, unable to eat, increased bilateral lower ext edema, ran out fluid pills on Friday, patient unable to sleep last night due to SOB

## 2012-04-11 NOTE — ED Provider Notes (Signed)
1:10 PM  Date: 04/11/2012  Rate: 99  Rhythm: normal sinus rhythm  QRS Axis: normal  Intervals: PR prolonged PQRS:  Left atrial abnormality;  Left ventricular hypertrophy  ST/T Wave abnormalities: nonspecific T wave changes  Conduction Disutrbances:none  Narrative Interpretation: Abnormal EKG  Old EKG Reviewed: changes noted--T wave changes are less prominent than on tracing of 07/24/2010.    Carleene Cooper III, MD 04/11/12 [phone]

## 2012-04-11 NOTE — ED Provider Notes (Signed)
History     CSN: [id number]  Arrival date & time 04/11/12  1030   First MD Initiated Contact with Patient 04/11/12 1146      Chief Complaint  Patient presents with  . Shortness of Breath    (Consider location/radiation/quality/duration/timing/severity/associated sxs/prior treatment) HPI Comments: Patient comes in today with a chief complaint of SOB. He reports that he has been feeling short of breath for several weeks, which is gradually worsening.  He is also complaining of dyspnea on exertion.  He reports that he becomes short of breath after walking a block. He is also complaining of orthopnea.  He reports that over the last couple of days he has been sitting up in a chair in order to sleep.  He has been on Lasix for LE edema.  He reports that he ran out of the medication two days ago.  He was taking 40 mg po daily, which he reports helped somewhat.  He denies any prior history of CHF.  He had an Echocardiogram done in October 2011 that showed LVH with a normal systolic function.  Patient has been evaluated for this in the past.  Patient also has a history of HTN and is noncompliant with antihypertensive medications.  Patient is a 41 y.o. male presenting with shortness of breath. The history is provided by the patient.  Shortness of Breath  Associated symptoms include cough and shortness of breath. Pertinent negatives include no chest pain, no fever and no wheezing.    Past Medical History  Diagnosis Date  . HTN (hypertension)     not taking medications daily    History reviewed. No pertinent past surgical history.  History reviewed. No pertinent family history.  History  Substance Use Topics  . Smoking status: Former Games developer  . Smokeless tobacco: Not on file  . Alcohol Use: Yes      Review of Systems  Constitutional: Negative for fever, chills and diaphoresis.  Respiratory: Positive for cough and shortness of breath. Negative for wheezing.   Cardiovascular: Positive for  leg swelling. Negative for chest pain and palpitations.  Gastrointestinal: Negative for nausea and vomiting.  Neurological: Negative for dizziness, syncope and light-headedness.  All other systems reviewed and are negative.    Allergies  Review of patient's allergies indicates no known allergies.  Home Medications   Current Outpatient Rx  Name Route Sig Dispense Refill  . CLONIDINE HCL 0.2 MG PO TABS Oral Take 1 tablet (0.2 mg total) by mouth 2 (two) times daily. 60 tablet 1  . FUROSEMIDE 40 MG PO TABS Oral Take 1 tablet (40 mg total) by mouth daily. 30 tablet 0    BP 162/121  Pulse 104  Temp 98.1 F (36.7 C) (Oral)  Resp 20  SpO2 99%  Physical Exam  Nursing note and vitals reviewed. Constitutional: He appears well-developed and well-nourished. No distress.  HENT:  Head: Normocephalic and atraumatic.  Mouth/Throat: Oropharynx is clear and moist.  Neck: Normal range of motion. Neck supple.  Cardiovascular: Normal rate, regular rhythm, normal heart sounds and intact distal pulses.   Pulmonary/Chest: Effort normal. No respiratory distress. He has no decreased breath sounds. He has no wheezes. He has no rhonchi. He has rales. He exhibits no tenderness.       Rales at the bases of lungs bilaterally  Abdominal: Soft. He exhibits no distension.  Musculoskeletal:       2+ pitting edema from the mid shin distally.  Neurological: He is alert.  Skin: Skin is warm  and dry. He is not diaphoretic. No erythema.  Psychiatric: He has a normal mood and affect.    ED Course  Procedures (including critical care time)   Labs Reviewed  CBC  DIFFERENTIAL  BASIC METABOLIC PANEL  PRO B NATRIURETIC PEPTIDE   Dg Chest 2 View  04/11/2012  *RADIOLOGY REPORT*  Clinical Data: Shortness of breath.  Chest congestion.  Smoker with history of hypertension.  CHEST - 2 VIEW  Comparison: Two-view chest x-ray 03/19/2012, 02/11/2012, 11/24/2010, 07/23/2010.  Findings: Cardiac silhouette enlarged, with  interval increase in the heart size since 2012.  Hilar and mediastinal contours otherwise unremarkable.  Mild pulmonary venous hypertension, less than on the recent prior examinations.  Subtle airspace opacity in the central left upper lobe, in the region of the left hilum. Lungs otherwise clear.  Pulmonary vascularity normal.  No pleural effusions.  Visualized bony thorax intact.  IMPRESSION: Subtle central left upper lobe pneumonia suspected.  Cardiomegaly without evidence of pulmonary edema.  Original Report Authenticated By: Arnell Sieving, M.D.     1. CHF (congestive heart failure)   2. CAP (community acquired pneumonia)     2:36 PM Discussed with Teaching Service.  They report that they will come see patient in the ED.  MDM  Patient presenting with SOB and increased LE edema.  No known prior history of CHF.  Patient found to have Cardiomegaly on CXR and a BNP of 5238.  Patient given 80mg  IV Lasix and nitro paste.  Patient also found to have CAP.  Patient started on IV Rocephin and IV Azithromycin.  Patient admitted to the hospital for further management.          Pascal Lux Clover, PA-C 04/11/12 1625

## 2012-04-11 NOTE — H&P (Signed)
Hospital Admission Note Date: 04/11/2012  Patient name: Jeffrey Murray Medical record number: [id number] Date of birth: Jan 03, 1971 Age: 41 y.o. Gender: male PCP: Pcp Not In System  Medical Service: B 1 [facility]   Attending physician:  Dr. Daiva Eves   1st Contact:  Dr. Dorise Hiss  Pager: [phone] 2nd Contact:  Dr. Dorthula Rue  Pager: [phone] After 5 pm or weekends: 1st Contact:      Pager: [phone] 2nd Contact:      Pager: [phone]  Chief Complaint: swelling and SOB  History of Present Illness: The patient is a 41 YO man with past medical history of hypertension and leg swelling. He does not have regular doctor and ran out of his medications about 2 days ago. He reports that since Friday he has noticed more swelling in his legs and more problems breathing. He has noticed SOB with exertion and has difficulty sleeping due to the SOB. He needs multiple pillows to prop himself up. He is not having chest pain. He states in [facility] he had stress test around 2010 which he thinks was fine. He has no history of heart attack or cath. His dad had heart problems around age 70. He does not have any leg pain and states that the swelling started getting bad without a fluid pill around 1 month ago. He has had a cough that is non-productive for two weeks now. He is not a smoker and has not been for some time. He denies any fevers or chills. He denies abdominal pain.   Meds: Current Outpatient Rx  Name Route Sig Dispense Refill  . CLONIDINE HCL 0.2 MG PO TABS Oral Take 1 tablet (0.2 mg total) by mouth 2 (two) times daily. 60 tablet 1  . FUROSEMIDE 40 MG PO TABS Oral Take 1 tablet (40 mg total) by mouth daily. 30 tablet 0  Also HCTZ  Allergies: Allergies as of 04/11/2012  . (No Known Allergies)   Past Medical History  Diagnosis Date  . HTN (hypertension)     not taking medications daily   History reviewed. No pertinent past surgical history. History reviewed. No pertinent family history. History   Social  History  . Marital Status: Married    Spouse Name: N/A    Number of Children: N/A  . Years of Education: N/A   Occupational History  . Not on file.   Social History Main Topics  . Smoking status: Former Games developer  . Smokeless tobacco: Not on file  . Alcohol Use: Yes  . Drug Use: No  . Sexually Active: Not on file   Other Topics Concern  . Not on file   Social History Narrative  . No narrative on file    Review of Systems: Pertinent items are noted in HPI.  Physical Exam: Blood pressure 158/118, pulse 100, temperature 98 F (36.7 C), temperature source Oral, resp. rate 22, SpO2 98.00%. General: resting in bed HEENT: PERRL, EOMI, no scleral icterus Cardiac: S 1 S 2 heard, slightly tachy Pulm: some crackles anteriorly around the apex, rales appreciated at the bases bilaterally Abd: soft, nontender, nondistended, BS present Ext: warm and well perfused, no pedal edema Neuro: alert and oriented X3, cranial nerves II-XII grossly intact   Lab results: Basic Metabolic Panel:  Basename 04/11/12 1211  NA 134*  K 4.9  CL 96  CO2 20  GLUCOSE 67*  BUN 16  CREATININE 1.41*  CALCIUM 9.8  MG --  PHOS --   CBC:  Basename 04/11/12 1211  WBC 10.5  NEUTROABS 8.3*  HGB 16.3  HCT 47.8  MCV 89.2  PLT 322   BNP:  Basename 04/11/12 1211  PROBNP 5087.0*   Imaging results:  Dg Chest 2 View  04/11/2012  *RADIOLOGY REPORT*  Clinical Data: Shortness of breath.  Chest congestion.  Smoker with history of hypertension.  CHEST - 2 VIEW  Comparison: Two-view chest x-ray 03/19/2012, 02/11/2012, 11/24/2010, 07/23/2010.  Findings: Cardiac silhouette enlarged, with interval increase in the heart size since 2012.  Hilar and mediastinal contours otherwise unremarkable.  Mild pulmonary venous hypertension, less than on the recent prior examinations.  Subtle airspace opacity in the central left upper lobe, in the region of the left hilum. Lungs otherwise clear.  Pulmonary vascularity normal.   No pleural effusions.  Visualized bony thorax intact.  IMPRESSION: Subtle central left upper lobe pneumonia suspected.  Cardiomegaly without evidence of pulmonary edema.  Original Report Authenticated By: Arnell Sieving, M.D.    Other results: EKG: normal EKG, normal sinus rhythm, unchanged from previous tracings, t wave inversions in V3-V5 which are old.  Assessment & Plan by Problem:  SOB - The patient does come in with SOB that is exacerbated by motion or by lying flat. Will need to get a recent echo to rule out CHF but currently sounds fluids overloaded. Given one dose of lasix in the ED and will use IV lasix BID and re-evaluate.   Possible pneumonia - The patient does have finding on x-ray that correlate with pneumonia versus non-resolving area on x-ray. Did get dose of rocephin and azithromycin in the ED. Will listen again to his lungs in the AM once he has diuresed some. If treated for CAP will need repeat CXR in 4-6 weeks.    HTN (hypertension) - BP is high in the ED at 160/115 and will restart lasix, HCTZ, clonidine. May consider changing regimen while he is here based on control.    Suspicion of CHF (congestive heart failure) - Please see above, will treat with lasix for fluid overload. If echo reveals failure will be new diagnosis and will need appropriate management with beta-blocker and education.   DVT ppx - Heparin 5000 units [place] TID  Signed: Genella Mech 04/11/2012, 3:25 PM   Internal Medicine Teaching Service Attending Note Date: 04/12/2012  Patient name: Jeffrey Murray  Medical record number: [id number]  Date of birth: April 10, 1971    This patient has been seen and discussed with the house staff. Please see their note for complete details. I concur with their findings with the following additions/corrections: Chronically his story does not fit well for him he acquired pneumonia but rather for heart failure exacerbation. We'll treat him aggressively with Lasix.  We'll get a 2-D echocardiogram. We'll continue to follow him clinically. Sterilely should his x-ray failed to resolve we may need to consider further investigation such as a CT scan of the chest.   Paulette Blanch Dam 04/12/2012, 11:06 AM

## 2012-04-11 NOTE — ED Notes (Signed)
Pt resting quietly at the time. Remains on cardiac monitor. Up to bathroom, labored respirations with exertion. Denies chest pain. Vital signs stable. Will continue to monitor. Pt given bedside urinal.

## 2012-04-11 NOTE — Progress Notes (Signed)
Medical screening examination/treatment/procedure(s) were conducted as a shared visit with non-physician practitioner(s) and myself.  I personally evaluated the patient during the encounter 41 yo man with neglected hypertension who presents with shortness of breath.  Exam shows bibasilar rales.  Lab workup shows L hilar pneumonia, cardiomegaly, increased vascular markings.  BNP over 5000.  Will need admission to treat CAP, CHF related to his hypertension.

## 2012-04-11 NOTE — ED Notes (Signed)
Pt resting quietly at the time. Remains on cardiac monitor. Denies pain. No signs of distress noted at the present.

## 2012-04-12 LAB — COMPREHENSIVE METABOLIC PANEL
ALT: 48 U/L (ref 0–53)
Alkaline Phosphatase: 196 U/L — ABNORMAL HIGH (ref 39–117)
BUN: 22 mg/dL (ref 6–23)
CO2: 27 mEq/L (ref 19–32)
Chloride: 95 mEq/L — ABNORMAL LOW (ref 96–112)
GFR calc Af Amer: 72 mL/min — ABNORMAL LOW (ref >90)
GFR calc non Af Amer: 62 mL/min — ABNORMAL LOW (ref >90)
Glucose, Bld: 133 mg/dL — ABNORMAL HIGH (ref 70–99)
Potassium: 3.9 mEq/L (ref 3.5–5.1)
Sodium: 133 mEq/L — ABNORMAL LOW (ref 135–145)
Total Bilirubin: 2.6 mg/dL — ABNORMAL HIGH (ref 0.3–1.2)

## 2012-04-12 LAB — CBC
MCHC: 34.9 g/dL (ref 30.0–36.0)
Platelets: 306 10*3/uL (ref 150–400)
RDW: 14.4 % (ref 11.5–15.5)
WBC: 8.4 10*3/uL (ref 4.0–10.5)

## 2012-04-12 LAB — CARDIAC PANEL(CRET KIN+CKTOT+MB+TROPI)
CK, MB: 2.2 ng/mL (ref 0.3–4.0)
Relative Index: INVALID (ref 0.0–2.5)
Total CK: 78 U/L (ref 7–232)
Troponin I: <0.3 ng/mL (ref <0.30)

## 2012-04-12 LAB — LIPID PANEL
LDL Cholesterol: 74 mg/dL (ref 0–99)
VLDL: 15 mg/dL (ref 0–40)

## 2012-04-12 MED ORDER — FUROSEMIDE 40 MG PO TABS
40.0000 mg | ORAL_TABLET | Freq: Two times a day (BID) | ORAL | Status: DC
  Administered 2012-04-12: 40 mg via ORAL
  Filled 2012-04-12 (×4): qty 1

## 2012-04-12 NOTE — Progress Notes (Signed)
*  PRELIMINARY RESULTS* Echocardiogram 2D Echocardiogram has been performed.  Jeryl Columbia 04/12/2012, 3:32 PM

## 2012-04-12 NOTE — ED Notes (Addendum)
Pt was given 500mg  IV zithromax on 04/11/2012 @ 16:05 per EDP.

## 2012-04-12 NOTE — Plan of Care (Signed)
Problem: Phase I Progression Outcomes Goal: EF % per last Echo/documented,Core Reminder form on chart EF 20%     

## 2012-04-12 NOTE — Progress Notes (Signed)
Subjective: Patient is feeling a little better this morning and is still having some SOB with exertion. He did sleep okay last night. No nausea/vomiting. No chest pain, abdominal pain. He has been urinating a lot. Still having mild cough but not productive. No fevers or chills overnight.  Objective: Vital signs in last 24 hours: Filed Vitals:   04/11/12 2100 04/12/12 0200 04/12/12 0625 04/12/12 1005  BP: 130/70 131/102 123/86 110/82  Pulse: 81 78 80   Temp: 97.4 F (36.3 C) 97.6 F (36.4 C) 97.9 F (36.6 C)   TempSrc: Oral Oral Oral   Resp: 20 18 18    Height:      Weight:   217 lb 12.8 oz (98.793 kg)   SpO2: 100% 97% 97%    Weight change:   Intake/Output Summary (Last 24 hours) at 04/12/12 1137 Last data filed at 04/12/12 0817  Gross per 24 hour  Intake    468 ml  Output   3300 ml  Net  -2832 ml   General: resting in bed HEENT: PERRL, EOMI, no scleral icterus Cardiac: RRR, no rubs, murmurs or gallops Pulm: some rales heard bilaterally, moving normal volumes of air Abd: soft, nontender, nondistended, BS present Ext: warm and well perfused, no pedal edema Neuro: alert and oriented X3, cranial nerves II-XII grossly intact  Lab Results: Basic Metabolic Panel:  Lab 04/12/12 1610 04/11/12 1211  NA 133* 134*  K 3.9 4.9  CL 95* 96  CO2 27 20  GLUCOSE 133* 67*  BUN 22 16  CREATININE 1.38* 1.41*  CALCIUM 9.3 9.8  MG -- --  PHOS -- --   Liver Function Tests:  Lab 04/12/12 0020  AST 56*  ALT 48  ALKPHOS 196*  BILITOT 2.6*  PROT 5.9*  ALBUMIN 2.9*   CBC:  Lab 04/12/12 0020 04/11/12 1211  WBC 8.4 10.5  NEUTROABS -- 8.3*  HGB 15.3 16.3  HCT 43.9 47.8  MCV 87.1 89.2  PLT 306 322   Cardiac Enzymes:  Lab 04/12/12 0829 04/12/12 0020 04/11/12 1716  CKTOTAL 66 78 99  CKMB 2.2 2.5 2.6  CKMBINDEX -- -- --  TROPONINI <0.30 <0.30 <0.30   BNP:  Lab 04/11/12 1211  PROBNP 5087.0*   Hemoglobin A1C:  Lab 04/11/12 1727  HGBA1C 6.1*   Fasting Lipid  Panel:  Lab 04/12/12 0020  CHOL 118  HDL 29*  LDLCALC 74  TRIG 76  CHOLHDL 4.1  LDLDIRECT --   Thyroid Function Tests:  Lab 04/11/12 1727  TSH 2.120  T4TOTAL --  FREET4 --  T3FREE --  THYROIDAB --   Studies/Results: Dg Chest 2 View  04/11/2012  *RADIOLOGY REPORT*  Clinical Data: Shortness of breath.  Chest congestion.  Smoker with history of hypertension.  CHEST - 2 VIEW  Comparison: Two-view chest x-ray 03/19/2012, 02/11/2012, 11/24/2010, 07/23/2010.  Findings: Cardiac silhouette enlarged, with interval increase in the heart size since 2012.  Hilar and mediastinal contours otherwise unremarkable.  Mild pulmonary venous hypertension, less than on the recent prior examinations.  Subtle airspace opacity in the central left upper lobe, in the region of the left hilum. Lungs otherwise clear.  Pulmonary vascularity normal.  No pleural effusions.  Visualized bony thorax intact.  IMPRESSION: Subtle central left upper lobe pneumonia suspected.  Cardiomegaly without evidence of pulmonary edema.  Original Report Authenticated By: Arnell Sieving, M.D.   Medications: I have reviewed the patient's current medications. Scheduled Meds:   . aspirin EC  81 mg Oral Daily  . cefTRIAXone (  ROCEPHIN)  IV  1 g Intravenous Once  . cloNIDine  0.2 mg Oral BID  . furosemide  40 mg Intravenous Once  . furosemide  40 mg Intravenous Once  . furosemide  40 mg Oral BID  . heparin  5,000 Units Subcutaneous Q8H  . nitroGLYCERIN  1 inch Topical Q6H  . sodium chloride  3 mL Intravenous Q12H  . sodium chloride  3 mL Intravenous Q12H  . DISCONTD: azithromycin (ZITHROMAX) 500 MG IVPB  500 mg Intravenous Once  . DISCONTD: cloNIDine  0.2 mg Oral BID  . DISCONTD: furosemide  80 mg Intravenous BID   Continuous Infusions:  PRN Meds:.sodium chloride, acetaminophen, acetaminophen, hydrALAZINE, morphine injection, sodium chloride Assessment/Plan:  SOB - Seems to be improving with diuresis. Will continue to diurese  today and switch to oral lasix. Will await 2D echo results. Continue to evaluate breathing and will repeat CXR tomorrow.   HTN (hypertension) - His blood pressure is under better control today and will watch him on current regimen.   Possible CHF (congestive heart failure) - Seems clinically likely and will await echo results and start any appropriate meds as needed.  Hyperbilirubinemia - Will check hepatitis panel.   Disposition - Per clinical course and echo.    LOS: 1 day   Genella Mech 04/12/2012, 11:37 AM

## 2012-04-13 ENCOUNTER — Encounter (HOSPITAL_COMMUNITY): Payer: Self-pay | Admitting: Physician Assistant

## 2012-04-13 ENCOUNTER — Inpatient Hospital Stay (HOSPITAL_COMMUNITY): Payer: Medicaid Other

## 2012-04-13 DIAGNOSIS — I130 Hypertensive heart and chronic kidney disease with heart failure and stage 1 through stage 4 chronic kidney disease, or unspecified chronic kidney disease: Secondary | ICD-10-CM

## 2012-04-13 DIAGNOSIS — N183 Chronic kidney disease, stage 3 unspecified: Secondary | ICD-10-CM

## 2012-04-13 DIAGNOSIS — I5021 Acute systolic (congestive) heart failure: Secondary | ICD-10-CM

## 2012-04-13 LAB — CBC
HCT: 43.5 % (ref 39.0–52.0)
Hemoglobin: 14.5 g/dL (ref 13.0–17.0)
MCH: 29.5 pg (ref 26.0–34.0)
MCHC: 33.3 g/dL (ref 30.0–36.0)
RDW: 14.8 % (ref 11.5–15.5)

## 2012-04-13 LAB — HEPATITIS PANEL, ACUTE
Hep A IgM: NEGATIVE
Hep B C IgM: NEGATIVE
Hepatitis B Surface Ag: NEGATIVE

## 2012-04-13 LAB — COMPREHENSIVE METABOLIC PANEL
Albumin: 2.4 g/dL — ABNORMAL LOW (ref 3.5–5.2)
Alkaline Phosphatase: 162 U/L — ABNORMAL HIGH (ref 39–117)
BUN: 21 mg/dL (ref 6–23)
Calcium: 8.6 mg/dL (ref 8.4–10.5)
GFR calc Af Amer: 72 mL/min — ABNORMAL LOW (ref >90)
Glucose, Bld: 82 mg/dL (ref 70–99)
Potassium: 3.5 mEq/L (ref 3.5–5.1)
Sodium: 135 mEq/L (ref 135–145)
Total Protein: 5 g/dL — ABNORMAL LOW (ref 6.0–8.3)

## 2012-04-13 MED ORDER — CARVEDILOL 6.25 MG PO TABS
6.2500 mg | ORAL_TABLET | Freq: Two times a day (BID) | ORAL | Status: DC
  Administered 2012-04-13: 16:00:00 via ORAL
  Administered 2012-04-14 – 2012-04-15 (×3): 6.25 mg via ORAL
  Filled 2012-04-13 (×6): qty 1

## 2012-04-13 MED ORDER — CARVEDILOL 12.5 MG PO TABS
12.5000 mg | ORAL_TABLET | Freq: Two times a day (BID) | ORAL | Status: DC
  Administered 2012-04-13: 12.5 mg via ORAL
  Filled 2012-04-13 (×3): qty 1

## 2012-04-13 MED ORDER — SPIRONOLACTONE 12.5 MG HALF TABLET
12.5000 mg | ORAL_TABLET | Freq: Every day | ORAL | Status: DC
  Administered 2012-04-13 – 2012-04-17 (×4): 12.5 mg via ORAL
  Filled 2012-04-13 (×5): qty 1

## 2012-04-13 MED ORDER — FUROSEMIDE 80 MG PO TABS
80.0000 mg | ORAL_TABLET | Freq: Two times a day (BID) | ORAL | Status: DC
  Filled 2012-04-13 (×2): qty 1

## 2012-04-13 MED ORDER — FUROSEMIDE 40 MG PO TABS
40.0000 mg | ORAL_TABLET | Freq: Two times a day (BID) | ORAL | Status: DC
  Filled 2012-04-13 (×2): qty 1

## 2012-04-13 MED ORDER — FUROSEMIDE 10 MG/ML IJ SOLN
80.0000 mg | Freq: Two times a day (BID) | INTRAMUSCULAR | Status: DC
  Administered 2012-04-13 – 2012-04-16 (×5): 80 mg via INTRAVENOUS
  Filled 2012-04-13 (×9): qty 8

## 2012-04-13 MED ORDER — FUROSEMIDE 40 MG PO TABS: 60.0000 mg | ORAL_TABLET | Freq: Two times a day (BID) | ORAL | Status: DC

## 2012-04-13 MED ORDER — LISINOPRIL 5 MG PO TABS
5.0000 mg | ORAL_TABLET | Freq: Every day | ORAL | Status: DC
  Administered 2012-04-13: 5 mg via ORAL
  Filled 2012-04-13: qty 1

## 2012-04-13 MED ORDER — LISINOPRIL 5 MG PO TABS
5.0000 mg | ORAL_TABLET | Freq: Two times a day (BID) | ORAL | Status: DC
  Administered 2012-04-13 – 2012-04-20 (×13): 5 mg via ORAL
  Filled 2012-04-13 (×15): qty 1

## 2012-04-13 NOTE — Progress Notes (Signed)
Internal Medicine Teaching Service Attending Note Date: 04/13/2012  Patient name: Jeffrey Murray  Medical record number: [id number]  Date of birth: Mar 03, 1971    This patient has been seen and discussed with the house staff. Please see their note for complete details. I concur with their findings and their plan of care. 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 Cardiology coming to see him and help [place] optimize medical management. Not clear to me what underlying cause of this severe cardiomyopathy was? He is not known to have CAD, or  cardiac arrythmia. Not documented cocaine user. Cannot pin down an infectious event here.   Paulette Blanch Dam 04/13/2012, 11:03 AM

## 2012-04-13 NOTE — Progress Notes (Signed)
Subjective: Patient is feeling a little better this morning and is still having some SOB with exertion. He did sleep okay last night. No nausea/vomiting. No chest pain, abdominal pain. Still having mild cough but not productive. No fevers or chills overnight. Talked to him about heart failure and that he will need to be diligent about taking meds and showing up to appointments.  Objective: Vital signs in last 24 hours: Filed Vitals:   04/12/12 1005 04/12/12 1409 04/12/12 2100 04/13/12 0500  BP: 110/82 117/83 128/90 121/88  Pulse:  71 70 78  Temp:  97.5 F (36.4 C) 98.1 F (36.7 C) 98.1 F (36.7 C)  TempSrc:  Oral Oral Oral  Resp:  22 20 20   Height:      Weight:    217 lb 14.4 oz (98.839 kg)  SpO2:  96% 100% 97%   Weight change: -3 lb 14.1 oz (-1.761 kg)  Intake/Output Summary (Last 24 hours) at 04/13/12 0846 Last data filed at 04/13/12 0500  Gross per 24 hour  Intake    940 ml  Output   1078 ml  Net   -138 ml   General: resting in bed HEENT: PERRL, EOMI, no scleral icterus Cardiac: RRR, no rubs, murmurs or gallops Pulm: some rales heard bilaterally, moving normal volumes of air Abd: soft, nontender, nondistended, BS present Ext: warm and well perfused, no pedal edema Neuro: alert and oriented X3, cranial nerves II-XII grossly intact  Lab Results: Basic Metabolic Panel:  Lab 04/13/12 4540 04/12/12 0020  NA 135 133*  K 3.5 3.9  CL 97 95*  CO2 28 27  GLUCOSE 82 133*  BUN 21 22  CREATININE 1.37* 1.38*  CALCIUM 8.6 9.3  MG -- --  PHOS -- --   Liver Function Tests:  Lab 04/13/12 0615 04/12/12 0020  AST 54* 56*  ALT 44 48  ALKPHOS 162* 196*  BILITOT 1.2 2.6*  PROT 5.0* 5.9*  ALBUMIN 2.4* 2.9*   CBC:  Lab 04/13/12 0615 04/12/12 0020 04/11/12 1211  WBC 7.8 8.4 --  NEUTROABS -- -- 8.3*  HGB 14.5 15.3 --  HCT 43.5 43.9 --  MCV 88.4 87.1 --  PLT 244 306 --   Cardiac Enzymes:  Lab 04/12/12 0829 04/12/12 0020 04/11/12 1716  CKTOTAL 66 78 99  CKMB 2.2 2.5  2.6  CKMBINDEX -- -- --  TROPONINI <0.30 <0.30 <0.30   BNP:  Lab 04/11/12 1211  PROBNP 5087.0*   Hemoglobin A1C:  Lab 04/11/12 1727  HGBA1C 6.1*   Fasting Lipid Panel:  Lab 04/12/12 0020  CHOL 118  HDL 29*  LDLCALC 74  TRIG 76  CHOLHDL 4.1  LDLDIRECT --   Thyroid Function Tests:  Lab 04/11/12 1727  TSH 2.120  T4TOTAL --  FREET4 --  T3FREE --  THYROIDAB --   Studies/Results: Dg Chest 2 View  04/13/2012  *RADIOLOGY REPORT*  Clinical Data: Shortness of breath, fever, follow-up pulmonary edema  CHEST - 2 VIEW  Comparison: 04/11/2012  Findings: Borderline enlargement of cardiac silhouette. Slight pulmonary vascular congestion. Mediastinal contours stable. Peribronchial thickening with decrease in left perihilar markings question improving infiltrate. Remaining lungs clear. No pleural effusion or pneumothorax. No acute osseous findings.  IMPRESSION: Minimal bronchitic changes. Decrease in left perihilar markings question improving infiltrate in left upper lobe.  Original Report Authenticated By: Lollie Marrow, M.D.   Dg Chest 2 View  04/11/2012  *RADIOLOGY REPORT*  Clinical Data: Shortness of breath.  Chest congestion.  Smoker with history of hypertension.  CHEST - 2 VIEW  Comparison: Two-view chest x-ray 03/19/2012, 02/11/2012, 11/24/2010, 07/23/2010.  Findings: Cardiac silhouette enlarged, with interval increase in the heart size since 2012.  Hilar and mediastinal contours otherwise unremarkable.  Mild pulmonary venous hypertension, less than on the recent prior examinations.  Subtle airspace opacity in the central left upper lobe, in the region of the left hilum. Lungs otherwise clear.  Pulmonary vascularity normal.  No pleural effusions.  Visualized bony thorax intact.  IMPRESSION: Subtle central left upper lobe pneumonia suspected.  Cardiomegaly without evidence of pulmonary edema.  Original Report Authenticated By: Arnell Sieving, M.D.   Medications: I have reviewed the  patient's current medications. Scheduled Meds:    . aspirin EC  81 mg Oral Daily  . carvedilol  12.5 mg Oral BID WC  . furosemide  40 mg Oral BID  . heparin  5,000 Units Subcutaneous Q8H  . lisinopril  5 mg Oral Daily  . nitroGLYCERIN  1 inch Topical Q6H  . sodium chloride  3 mL Intravenous Q12H  . DISCONTD: cloNIDine  0.2 mg Oral BID  . DISCONTD: furosemide  80 mg Intravenous BID  . DISCONTD: furosemide  40 mg Oral BID  . DISCONTD: furosemide  60 mg Oral BID  . DISCONTD: sodium chloride  3 mL Intravenous Q12H   Continuous Infusions:  PRN Meds:.sodium chloride, acetaminophen, sodium chloride, DISCONTD: acetaminophen, DISCONTD: hydrALAZINE, DISCONTD:  morphine injection Assessment/Plan:  SOB - Seems to be improving with diuresis. Echo results came back with EF 20%. Will start beta-blocker, ACE-I, stop clonidine. Will have heart failure team see.   Hypertension - His blood pressure is under better control today and will watch him on current regimen.   Possible CHF (congestive heart failure) - EF 20%, Started beta-blocker, ACE-I and will have HF team see pt.   Hyperbilirubinemia - Hepatitis panel pending. Bilirubin normal this morning.   Disposition - Heart failure team to see and discharge tomorrow.    LOS: 2 days   Genella Mech 04/13/2012, 8:46 AM

## 2012-04-13 NOTE — Consult Note (Signed)
Advanced Heart Failure Team Consult Note   Primary Physician: none Primary Cardiologist: none   Referring Physician: Internal Medicine Teaching Service  Reason for Consult: New onset HF   HPI:    Jeffrey Murray is a 41 y.o. gentlemen with history of hypertension and chronic renal failure (baseline Cr 1.3-1.5).    Admitted 6/23 for progressive dyspnea and lower extremity edema since May.  He also complains of scrotal edema.  ProBNP 5000, CXR with central left upper lobe PNA suspected, cardiomegaly without evidence of pulmonary edema.  Initially treated with rocephin and azithromycin for possible PNA.  Also treated with IV lasix, has diuresed Had echo completed showing EF 20% (EF nml per stress echo 2011), severe hypokinesis of the entire myocardium. Mild MR.  Mod dilated RV.  Mod dilated RA.  Mod-severe TR.  PAPP 36 mmHg.  His clonidine has been discontinued and carvedilol and lisinopril started, tolerating these well.  Prior to the hospital he was having orthopnea and lower extremity edema.  During admission his weight is down 4 pounds, diuresed 3 liters.  Transitioned to oral lasix yesterday with +I/Os.  Says breathing has improved since admit.  He still has occasional orthopnea.  He denies syncope or chest pain.  He says he takes his blood pressure at home and it is 180-190/100s.  Says clonidine is the only thing that he takes that helps with BP.  He is not compliant with his meds.  He has lost 40-50 pounds and his snoring has improved.  He drinks 2-3 24 oz beers almost everyday, this has cut back.  Admits to marijuana use no other drug use.  He has a history of tobacco use but has stopped using this.  Lives with 46 year old daughter, wife still lives in [place].  Currently looking for work.     Review of Systems: [y] = yes, [ ]  = no   General: Weight gain [ y]; Weight loss [ ] ; Anorexia [ ] ; Fatigue [ ] ; Fever [ ] ; Chills [ ] ; Weakness [ ]   Cardiac: Chest pain/pressure [ ] ; Resting SOB [  ]; Exertional SOB Cove.Etienne ]; Orthopnea [ y]; Pedal Edema Cove.[place]ienne ]; Palpitations [ ] ; Syncope [ ] ; Presyncope [ ] ; Paroxysmal nocturnal dyspnea[ ]   Pulmonary: Cough [ ] ; Wheezing[ ] ; Hemoptysis[ ] ; Sputum [ ] ; Snoring [ ]   GI: Vomiting[ ] ; Dysphagia[ ] ; Melena[ ] ; Hematochezia [ ] ; Heartburn[ ] ; Abdominal pain [ ] ; Constipation [ ] ; Diarrhea [ ] ; BRBPR [ ]   GU: Hematuria[ ] ; Dysuria [ ] ; Nocturia[ ]   Vascular: Pain in legs with walking [ ] ; Pain in feet with lying flat [ ] ; Non-healing sores [ ] ; Stroke [ ] ; TIA [ ] ; Slurred speech [ ] ;  Neuro: Headaches[ ] ; Vertigo[ ] ; Seizures[ ] ; Paresthesias[ ] ;Blurred vision [ ] ; Diplopia [ ] ; Vision changes [ ]   Ortho/Skin: Arthritis [ ] ; Joint pain [ ] ; Muscle pain [ ] ; Joint swelling [ ] ; Back Pain [ ] ; Rash [ ]   Psych: Depression[ ] ; Anxiety[ ]   Heme: Bleeding problems [ ] ; Clotting disorders [ ] ; Anemia [ ]   Endocrine: Diabetes [ ] ; Thyroid dysfunction[ ]   Home Medications Prior to Admission medications   Medication Sig Start Date End Date Taking? Authorizing Provider  cloNIDine (CATAPRES) 0.2 MG tablet Take 1 tablet (0.2 mg total) by mouth 2 (two) times daily. 03/19/12 03/19/13 Yes Tatyana A Kirichenko, PA  furosemide (LASIX) 40 MG tablet Take 1 tablet (40 mg total) by mouth daily. 02/11/12 02/10/13 Yes Tinnie Gens  Caporossi, MD    Past Medical History: Past Medical History  Diagnosis Date  . HTN (hypertension)     not taking medications daily  . CHF (congestive heart failure)     Sytolic, EF 20% by echo 04/11/12    Past Surgical History: History reviewed. No pertinent past surgical history.  Family History: History reviewed. No pertinent family history. Father - heart problems in 42s Fathers family with DM  Social History: History   Social History  . Marital Status: Married    Spouse Name: N/A    Number of Children: N/A  . Years of Education: N/A   Social History Main Topics  . Smoking status: Former Games developer  . Smokeless tobacco: None  .  Alcohol Use: Yes  . Drug Use: No  . Sexually Active: None   Other Topics Concern  . None   Social History Narrative  . 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   Allergies:  No Known Allergies  Objective:    Vital Signs:   Temp:  [97.5 F (36.4 C)-98.1 F (36.7 C)] 98.1 F (36.7 C) (06/25 0500) Pulse Rate:  [70-78] 78  (06/25 0500) Resp:  [20-22] 20  (06/25 0500) BP: (110-128)/(82-90) 121/88 mmHg (06/25 0500) SpO2:  [96 %-100 %] 97 % (06/25 0500) Weight:  [98.839 kg (217 lb 14.4 oz)] 98.839 kg (217 lb 14.4 oz) (06/25 0500) Last BM Date: 04/12/12 Filed Weights   04/11/12 1638 04/12/12 0625 04/13/12 0500  Weight: 100.6 kg (221 lb 12.5 oz) 98.793 kg (217 lb 12.8 oz) 98.839 kg (217 lb 14.4 oz)    Physical Exam: General:  Well appearing. No resp difficulty HEENT: normal besides scar on left cheek Neck: supple. JVP to ear. Carotids 2+ bilat; no bruits. No lymphadenopathy or thryomegaly appreciated. Cor: PMI nondisplaced. Regular rate & rhythm. No S3.  Soft TR Lungs: rhonchi with bibasilar crackles  Abdomen: soft, nontender, nondistended. No hepatosplenomegaly. No bruits or masses. Good bowel sounds. Extremities: no cyanosis, clubbing, rash, edema Neuro: alert & orientedx3, cranial nerves grossly intact. moves all 4 extremities w/o difficulty. Affect pleasant  Telemetry:   Labs: Basic Metabolic Panel:  Lab 04/13/12 1610 04/12/12 0020 04/11/12 1211  NA 135 133* 134*  K 3.5 3.9 4.9  CL 97 95* 96  CO2 28 27 20   GLUCOSE 82 133* 67*  BUN 21 22 16   CREATININE 1.37* 1.38* 1.41*  CALCIUM 8.6 9.3 9.8  MG -- -- --  PHOS -- -- --    Liver Function Tests:  Lab 04/13/12 0615 04/12/12 0020  AST 54* 56*  ALT 44 48  ALKPHOS 162* 196*  BILITOT 1.2 2.6*  PROT 5.0* 5.9*  ALBUMIN 2.4* 2.9*   No results found for this basename: LIPASE:5,AMYLASE:5 in the last 168 hours No results found for this basename: AMMONIA:3 in the last 168  hours  CBC:  Lab 04/13/12 0615 04/12/12 0020 04/11/12 1211  WBC 7.8 8.4 10.5  NEUTROABS -- -- 8.3*  HGB 14.5 15.3 16.3  HCT 43.5 43.9 47.8  MCV 88.4 87.1 89.2  PLT 244 306 322    Cardiac Enzymes:  Lab 04/12/12 0829 04/12/12 0020 04/11/12 1716  CKTOTAL 66 78 99  CKMB 2.2 2.5 2.6  CKMBINDEX -- -- --  TROPONINI <0.30 <0.30 <0.30    BNP: BNP (last 3 results)  Basename 04/11/12 1211 03/19/12 1120 02/11/12 1014  PROBNP 5087.0* 2723.0* 3701.0*    CBG: No results found for this basename:  GLUCAP:5 in the last 168 hours  Coagulation Studies: No results found for this basename: LABPROT:5,INR:5 in the last 72 hours  Other results: EKG:  Imaging: Dg Chest 2 View  04/13/2012  *RADIOLOGY REPORT*  Clinical Data: Shortness of breath, fever, follow-up pulmonary edema  CHEST - 2 VIEW  Comparison: 04/11/2012  Findings: Borderline enlargement of cardiac silhouette. Slight pulmonary vascular congestion. Mediastinal contours stable. Peribronchial thickening with decrease in left perihilar markings question improving infiltrate. Remaining lungs clear. No pleural effusion or pneumothorax. No acute osseous findings.  IMPRESSION: Minimal bronchitic changes. Decrease in left perihilar markings question improving infiltrate in left upper lobe.  Original Report Authenticated By: Lollie Marrow, M.D.   Dg Chest 2 View  04/11/2012  *RADIOLOGY REPORT*  Clinical Data: Shortness of breath.  Chest congestion.  Smoker with history of hypertension.  CHEST - 2 VIEW  Comparison: Two-view chest x-ray 03/19/2012, 02/11/2012, 11/24/2010, 07/23/2010.  Findings: Cardiac silhouette enlarged, with interval increase in the heart size since 2012.  Hilar and mediastinal contours otherwise unremarkable.  Mild pulmonary venous hypertension, less than on the recent prior examinations.  Subtle airspace opacity in the central left upper lobe, in the region of the left hilum. Lungs otherwise clear.  Pulmonary vascularity normal.   No pleural effusions.  Visualized bony thorax intact.  IMPRESSION: Subtle central left upper lobe pneumonia suspected.  Cardiomegaly without evidence of pulmonary edema.  Original Report Authenticated By: Arnell Sieving, M.D.         Assessment:   1) Acute systolic heart failure 2) Presumed NICM, EF 20% 3) Hypertension 4) Chronic renal failure, baseline Cr 1.3-1.5 5) Alcohol use 6) Obesity s/p 40 pound weight loss 7) h/o tobacco use now quit 8) stress echo 2011 - normal EF no ischemia  Plan/Discussion:    Attending: Patient seen and examined with Ulyess Blossom, PA-C. We discussed all aspects of the encounter. I agree with the assessment and plan as stated above.   I have reviewed his records at length. 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 He has improved with diuresis but remains volume overloaded on exam. Would start 80mg  IV lasix bis. Cut carvedilol to 6.25 bid. Increase lisinopril to 5 bid and add low-dose spiro 12.5. Be careful not to drop SBP < 120. Long talk with him about pathophysiology of HF and significant risk of death if he is noncompliant with therapy.  We will continue to follow. I have d/w Dr. Daiva Eves. I would be fine with transferring him to my service if the Medicine service would prefer.   Aurianna Earlywine,MD 2:19 PM        Length of Stay: 2   Robbi Garter 04/13/2012, 9:49 AM

## 2012-04-13 NOTE — Progress Notes (Signed)
Nursing Note: Pt given the CHF booklet and educated about limiting salt intake and importance of weighing daily and recording daily weight. Pt verbalized understanding. Will continue to monitor pt. Saabir Blyth Scientist, clinical (histocompatibility and immunogenetics).

## 2012-04-14 DIAGNOSIS — I130 Hypertensive heart and chronic kidney disease with heart failure and stage 1 through stage 4 chronic kidney disease, or unspecified chronic kidney disease: Secondary | ICD-10-CM

## 2012-04-14 DIAGNOSIS — I5021 Acute systolic (congestive) heart failure: Secondary | ICD-10-CM

## 2012-04-14 DIAGNOSIS — N189 Chronic kidney disease, unspecified: Secondary | ICD-10-CM

## 2012-04-14 LAB — BASIC METABOLIC PANEL
CO2: 30 mEq/L (ref 19–32)
Chloride: 100 mEq/L (ref 96–112)
Glucose, Bld: 101 mg/dL — ABNORMAL HIGH (ref 70–99)
Sodium: 139 mEq/L (ref 135–145)

## 2012-04-14 LAB — CBC
HCT: 44.8 % (ref 39.0–52.0)
Hemoglobin: 14.8 g/dL (ref 13.0–17.0)
MCH: 29.5 pg (ref 26.0–34.0)
MCV: 89.4 fL (ref 78.0–100.0)
RBC: 5.01 MIL/uL (ref 4.22–5.81)
WBC: 6 10*3/uL (ref 4.0–10.5)

## 2012-04-14 MED ORDER — METOLAZONE 2.5 MG PO TABS
2.5000 mg | ORAL_TABLET | Freq: Every day | ORAL | Status: DC
  Filled 2012-04-14: qty 1

## 2012-04-14 MED ORDER — POTASSIUM CHLORIDE CRYS ER 20 MEQ PO TBCR
20.0000 meq | EXTENDED_RELEASE_TABLET | Freq: Two times a day (BID) | ORAL | Status: DC
  Administered 2012-04-14 – 2012-04-15 (×2): 20 meq via ORAL
  Filled 2012-04-14 (×4): qty 1

## 2012-04-14 MED ORDER — POTASSIUM CHLORIDE CRYS ER 20 MEQ PO TBCR
40.0000 meq | EXTENDED_RELEASE_TABLET | Freq: Once | ORAL | Status: AC
  Administered 2012-04-14: 40 meq via ORAL
  Filled 2012-04-14: qty 2

## 2012-04-14 MED ORDER — METOLAZONE 2.5 MG PO TABS
2.5000 mg | ORAL_TABLET | Freq: Two times a day (BID) | ORAL | Status: DC
  Administered 2012-04-14: 2.5 mg via ORAL
  Filled 2012-04-14 (×3): qty 1

## 2012-04-14 NOTE — Progress Notes (Signed)
CO sign for IV charting, meds, and assessments for Rashida Haney RN 7a-7p. Nino Glow RN

## 2012-04-14 NOTE — Progress Notes (Signed)
Advanced Heart Failure Rounding Note   Subjective:    41 year old gentlemen with hypertension and newly diagnosed systolic heart failure, EF 20%.  History of alcohol abuse.   IV Lasix increased yesterday to 80 mg BID.  Minimal increase in urine output.  Says he is drinking ginger ale, coke, water and ice during the day.  No orthopnea/PND.  Scrotal edema improving.   Objective:   Weight Range:  Vital Signs:   Temp:  [97.9 F (36.6 C)-98.3 F (36.8 C)] 98 F (36.7 C) (06/26 0619) Pulse Rate:  [59-78] 76  (06/26 0619) Resp:  [20-22] 20  (06/26 0619) BP: (112-123)/(81-91) 123/91 mmHg (06/26 0619) SpO2:  [98 %-100 %] 98 % (06/26 0619) Weight:  [100.1 kg (220 lb 10.9 oz)] 100.1 kg (220 lb 10.9 oz) (06/26 0619) Last BM Date: 04/13/12  Weight change: Filed Weights   04/12/12 0625 04/13/12 0500 04/14/12 0619  Weight: 98.793 kg (217 lb 12.8 oz) 98.839 kg (217 lb 14.4 oz) 100.1 kg (220 lb 10.9 oz)    Intake/Output:   Intake/Output Summary (Last 24 hours) at 04/14/12 0946 Last data filed at 04/14/12 0622  Gross per 24 hour  Intake    945 ml  Output   1375 ml  Net   -430 ml     Physical Exam: General:  Well appearing. No resp difficulty HEENT: normal Neck: supple. JVP to ear. Carotids 2+ bilat; no bruits. No lymphadenopathy or thryomegaly appreciated. Cor: PMI nondisplaced. Regular rate & rhythm. Soft TR Lungs: bibasilar crackles, R>L Abdomen: soft, nontender, nondistended. No hepatosplenomegaly. No bruits or masses. Good bowel sounds. Extremities: no cyanosis, clubbing, rash, trace L ankle edema Neuro: alert & orientedx3, cranial nerves grossly intact. moves all 4 extremities w/o difficulty. Affect pleasant  Telemetry: NSR with PVC and occ trigeminy   Labs: Basic Metabolic Panel:  Lab 04/14/12 2956 04/13/12 0615 04/12/12 0020 04/11/12 1211  NA 139 135 133* 134*  K 3.6 3.5 3.9 4.9  CL 100 97 95* 96  CO2 30 28 27 20   GLUCOSE 101* 82 133* 67*  BUN 17 21 22 16     CREATININE 1.24 1.37* 1.38* 1.41*  CALCIUM 8.8 8.6 9.3 --  MG -- -- -- --  PHOS -- -- -- --    Liver Function Tests:  Lab 04/13/12 0615 04/12/12 0020  AST 54* 56*  ALT 44 48  ALKPHOS 162* 196*  BILITOT 1.2 2.6*  PROT 5.0* 5.9*  ALBUMIN 2.4* 2.9*   No results found for this basename: LIPASE:5,AMYLASE:5 in the last 168 hours No results found for this basename: AMMONIA:3 in the last 168 hours  CBC:  Lab 04/14/12 0540 04/13/12 0615 04/12/12 0020 04/11/12 1211  WBC 6.0 7.8 8.4 10.5  NEUTROABS -- -- -- 8.3*  HGB 14.8 14.5 15.3 16.3  HCT 44.8 43.5 43.9 47.8  MCV 89.4 88.4 87.1 89.2  PLT 259 244 306 322    Cardiac Enzymes:  Lab 04/12/12 0829 04/12/12 0020 04/11/12 1716  CKTOTAL 66 78 99  CKMB 2.2 2.5 2.6  CKMBINDEX -- -- --  TROPONINI <0.30 <0.30 <0.30    BNP: BNP (last 3 results)  Basename 04/11/12 1211 03/19/12 1120 02/11/12 1014  PROBNP 5087.0* 2723.0* 3701.0*     Other results:  EKG:   Imaging: Dg Chest 2 View  04/13/2012  *RADIOLOGY REPORT*  Clinical Data: Shortness of breath, fever, follow-up pulmonary edema  CHEST - 2 VIEW  Comparison: 04/11/2012  Findings: Borderline enlargement of cardiac silhouette. Slight pulmonary vascular  congestion. Mediastinal contours stable. Peribronchial thickening with decrease in left perihilar markings question improving infiltrate. Remaining lungs clear. No pleural effusion or pneumothorax. No acute osseous findings.  IMPRESSION: Minimal bronchitic changes. Decrease in left perihilar markings question improving infiltrate in left upper lobe.  Original Report Authenticated By: Lollie Marrow, M.D.      Medications:     Scheduled Medications:    . aspirin EC  81 mg Oral Daily  . carvedilol  6.25 mg Oral BID WC  . furosemide  80 mg Intravenous BID  . heparin  5,000 Units Subcutaneous Q8H  . lisinopril  5 mg Oral BID  . sodium chloride  3 mL Intravenous Q12H  . spironolactone  12.5 mg Oral Daily  . DISCONTD: carvedilol   12.5 mg Oral BID WC  . DISCONTD: furosemide  40 mg Oral BID  . DISCONTD: furosemide  80 mg Oral BID  . DISCONTD: lisinopril  5 mg Oral Daily  . DISCONTD: nitroGLYCERIN  1 inch Topical Q6H     Infusions:     PRN Medications:  sodium chloride, acetaminophen, sodium chloride   Assessment:   1) Acute systolic heart failure  2) Presumed NICM, EF 20%  3) Hypertension  4) Chronic renal failure, baseline Cr 1.3-1.5  5) Alcohol use  6) Obesity s/p 40 pound weight loss  7) h/o tobacco use now quit  8) Stress echo 2011 - normal EF no ischemia 9) PVCs  Plan/Discussion:    Volume status unchanged from yesterday, did receive 2 doses of IV lasix 80 mg.  Will add metolazone today.  Will check urine output this afternoon and if it remains sluggish a short course of milrinone could be used.  Will replete K with PVCs on tele.  Check mag.  He may benefit from LifeVest at discharge.  Will discuss with attending.     Length of Stay: 3   Robbi Garter, [facility] 04/14/2012, 9:46 AM  Attending: Patient seen and examined with Ulyess Blossom, PA-C. We discussed all aspects of the encounter. I agree with the assessment and plan as stated above. Urine output picking up with metolazone. If not improving overnight would have low threshold for R and possibly L HC in am to assess outputs and filling pressures. Will continue to follow ectopy. A LifeVest may be a reasonable option. BP on low side would not titrate meds farther at this point.   Florentino Laabs,MD 12:29 PM

## 2012-04-15 ENCOUNTER — Encounter (HOSPITAL_COMMUNITY)
Admission: EM | Disposition: A | Discharge status: Home / Self Care | Payer: Self-pay | Source: Ambulatory Visit | Attending: Internal Medicine

## 2012-04-15 DIAGNOSIS — I509 Heart failure, unspecified: Secondary | ICD-10-CM

## 2012-04-15 HISTORY — PX: LEFT AND RIGHT HEART CATHETERIZATION WITH CORONARY ANGIOGRAM: SHX5449

## 2012-04-15 LAB — BASIC METABOLIC PANEL
BUN: 21 mg/dL (ref 6–23)
CO2: 31 mEq/L (ref 19–32)
Calcium: 9.2 mg/dL (ref 8.4–10.5)
Chloride: 99 mEq/L (ref 96–112)
Creatinine, Ser: 1.46 mg/dL — ABNORMAL HIGH (ref 0.50–1.35)

## 2012-04-15 LAB — CBC
HCT: 42.9 % (ref 39.0–52.0)
Hemoglobin: 14.9 g/dL (ref 13.0–17.0)
MCHC: 34.7 g/dL (ref 30.0–36.0)
WBC: 5 10*3/uL (ref 4.0–10.5)

## 2012-04-15 LAB — CREATININE, SERUM
GFR calc Af Amer: 70 mL/min — ABNORMAL LOW (ref >90)
GFR calc non Af Amer: 60 mL/min — ABNORMAL LOW (ref >90)

## 2012-04-15 LAB — POCT I-STAT 3, VENOUS BLOOD GAS (G3P V)
Bicarbonate: 26 mEq/L — ABNORMAL HIGH (ref 20.0–24.0)
O2 Saturation: 46 %
pCO2, Ven: 46.3 mmHg (ref 45.0–50.0)
pH, Ven: 7.401 — ABNORMAL HIGH (ref 7.250–7.300)
pO2, Ven: 25 mmHg — CL (ref 30.0–45.0)

## 2012-04-15 LAB — PROTIME-INR
INR: 1.17 (ref 0.00–1.49)
Prothrombin Time: 15.1 seconds (ref 11.6–15.2)

## 2012-04-15 LAB — POCT I-STAT 3, ART BLOOD GAS (G3+)
TCO2: 27 mmol/L (ref 0–100)
pH, Arterial: 7.527 — ABNORMAL HIGH (ref 7.350–7.450)

## 2012-04-15 LAB — MAGNESIUM: Magnesium: 1.8 mg/dL (ref 1.5–2.5)

## 2012-04-15 LAB — MRSA PCR SCREENING: MRSA by PCR: NEGATIVE

## 2012-04-15 SURGERY — LEFT AND RIGHT HEART CATHETERIZATION WITH CORONARY ANGIOGRAM
Anesthesia: LOCAL

## 2012-04-15 MED ORDER — SODIUM CHLORIDE 0.9 % IJ SOLN: 3.0000 mL | Freq: Two times a day (BID) | INTRAMUSCULAR | Status: DC

## 2012-04-15 MED ORDER — SODIUM CHLORIDE 0.9 % IV SOLN: 250.0000 mL | INTRAVENOUS | Status: DC | PRN

## 2012-04-15 MED ORDER — SODIUM CHLORIDE 0.9 % IJ SOLN
3.0000 mL | Freq: Two times a day (BID) | INTRAMUSCULAR | Status: DC
  Administered 2012-04-16 – 2012-04-20 (×6): 3 mL via INTRAVENOUS

## 2012-04-15 MED ORDER — SODIUM CHLORIDE 0.9 % IJ SOLN: 10.0000 mL | INTRAMUSCULAR | Status: DC | PRN

## 2012-04-15 MED ORDER — LIDOCAINE HCL (PF) 1 % IJ SOLN
INTRAMUSCULAR | Status: AC
  Filled 2012-04-15: qty 30

## 2012-04-15 MED ORDER — MIDAZOLAM HCL 2 MG/2ML IJ SOLN
INTRAMUSCULAR | Status: AC
  Filled 2012-04-15: qty 2

## 2012-04-15 MED ORDER — DIGOXIN 125 MCG PO TABS
0.1250 mg | ORAL_TABLET | Freq: Every day | ORAL | Status: DC
  Administered 2012-04-15 – 2012-04-20 (×6): 0.125 mg via ORAL
  Filled 2012-04-15 (×6): qty 1

## 2012-04-15 MED ORDER — MILRINONE IN DEXTROSE 200-5 MCG/ML-% IV SOLN
0.1250 ug/kg/min | INTRAVENOUS | Status: DC
  Filled 2012-04-15: qty 100

## 2012-04-15 MED ORDER — SODIUM CHLORIDE 0.9 % IV SOLN
250.0000 mL | INTRAVENOUS | Status: DC | PRN
  Administered 2012-04-18: 250 mL via INTRAVENOUS

## 2012-04-15 MED ORDER — HEPARIN (PORCINE) IN NACL 2-0.9 UNIT/ML-% IJ SOLN
INTRAMUSCULAR | Status: AC
  Filled 2012-04-15: qty 1000

## 2012-04-15 MED ORDER — ENOXAPARIN SODIUM 40 MG/0.4ML ~~LOC~~ SOLN
40.0000 mg | SUBCUTANEOUS | Status: DC
  Administered 2012-04-16 – 2012-04-20 (×5): 40 mg via SUBCUTANEOUS
  Filled 2012-04-15 (×6): qty 0.4

## 2012-04-15 MED ORDER — ONDANSETRON HCL 4 MG/2ML IJ SOLN: 4.0000 mg | Freq: Four times a day (QID) | INTRAMUSCULAR | Status: DC | PRN

## 2012-04-15 MED ORDER — MAGNESIUM SULFATE 40 MG/ML IJ SOLN
2.0000 g | Freq: Once | INTRAMUSCULAR | Status: AC
  Administered 2012-04-15: 2 g via INTRAVENOUS
  Filled 2012-04-15 (×2): qty 50

## 2012-04-15 MED ORDER — SODIUM CHLORIDE 0.9 % IJ SOLN
10.0000 mL | Freq: Two times a day (BID) | INTRAMUSCULAR | Status: DC
  Administered 2012-04-16 – 2012-04-20 (×8): 10 mL

## 2012-04-15 MED ORDER — NITROGLYCERIN 0.2 MG/ML ON CALL CATH LAB
INTRAVENOUS | Status: AC
  Filled 2012-04-15: qty 1

## 2012-04-15 MED ORDER — MILRINONE IN DEXTROSE 200-5 MCG/ML-% IV SOLN
0.1250 ug/kg/min | INTRAVENOUS | Status: DC
  Administered 2012-04-15 – 2012-04-18 (×6): 0.25 ug/kg/min via INTRAVENOUS
  Administered 2012-04-18: 0.125 ug/kg/min via INTRAVENOUS
  Filled 2012-04-15 (×7): qty 100

## 2012-04-15 MED ORDER — SODIUM CHLORIDE 0.9 % IJ SOLN: 3.0000 mL | INTRAMUSCULAR | Status: DC | PRN

## 2012-04-15 MED ORDER — FENTANYL CITRATE 0.05 MG/ML IJ SOLN
INTRAMUSCULAR | Status: AC
  Filled 2012-04-15: qty 2

## 2012-04-15 MED ORDER — ASPIRIN 81 MG PO CHEW
CHEWABLE_TABLET | ORAL | Status: AC
  Filled 2012-04-15: qty 4

## 2012-04-15 MED ORDER — ACETAMINOPHEN 325 MG PO TABS
650.0000 mg | ORAL_TABLET | ORAL | Status: DC | PRN
  Administered 2012-04-15 – 2012-04-19 (×4): 650 mg via ORAL
  Filled 2012-04-15 (×3): qty 2

## 2012-04-15 MED ORDER — MILRINONE IN DEXTROSE 200-5 MCG/ML-% IV SOLN
0.2500 ug/kg/min | INTRAVENOUS | Status: AC
  Administered 2012-04-15: 0.25 ug/kg/min via INTRAVENOUS
  Filled 2012-04-15: qty 100

## 2012-04-15 MED ORDER — ASPIRIN 81 MG PO CHEW
324.0000 mg | CHEWABLE_TABLET | ORAL | Status: AC
  Administered 2012-04-15: 324 mg via ORAL

## 2012-04-15 NOTE — H&P (View-Only) (Signed)
Advanced Heart Failure Rounding Note   Subjective:    41 year old gentlemen with hypertension and newly diagnosed systolic heart failure, EF 20%.  History of alcohol abuse.   Metolazone 2.5 mg BID added to IV lasix 80 mg BID yesterday with marked improvement in UOP (-5 liters).  Down 9 pounds since yesterday.  However says he still couldn't sleep due to orthopnea. No CP.   Objective:   Weight Range:  Vital Signs:   Temp:  [97.2 F (36.2 C)-98.2 F (36.8 C)] 98.2 F (36.8 C) (06/27 0513) Pulse Rate:  [72-78] 78  (06/27 0513) Resp:  [20] 20  (06/27 0513) BP: (105-135)/(70-94) 118/92 mmHg (06/27 0513) SpO2:  [99 %-100 %] 100 % (06/27 0513) Weight:  [95.8 kg (211 lb 3.2 oz)] 95.8 kg (211 lb 3.2 oz) (06/27 0513) Last BM Date: 04/14/12  Weight change: Filed Weights   04/13/12 0500 04/14/12 0619 04/15/12 0513  Weight: 98.839 kg (217 lb 14.4 oz) 100.1 kg (220 lb 10.9 oz) 95.8 kg (211 lb 3.2 oz)    Intake/Output:   Intake/Output Summary (Last 24 hours) at 04/15/12 0637 Last data filed at 04/15/12 0518  Gross per 24 hour  Intake    480 ml  Output   5500 ml  Net  -5020 ml     Physical Exam: General:  Well appearing. No resp difficulty HEENT: normal Neck: supple. JVP to ear. Carotids 2+ bilat; no bruits. No lymphadenopathy or thryomegaly appreciated. Cor: PMI nondisplaced. Regular rate & rhythm. Soft TR Lungs: bibasilar crackles, R>L Abdomen: soft, nontender, nondistended. No hepatosplenomegaly. No bruits or masses. Good bowel sounds. Extremities: no cyanosis, clubbing, rash, trace L ankle edema Neuro: alert & orientedx3, cranial nerves grossly intact. moves all 4 extremities w/o difficulty. Affect pleasant  Telemetry: NSR with PVC and occ trigeminy   Labs: Basic Metabolic Panel:  Lab 04/14/12 4540 04/13/12 0615 04/12/12 0020 04/11/12 1211  NA 139 135 133* 134*  K 3.6 3.5 3.9 4.9  CL 100 97 95* 96  CO2 30 28 27 20   GLUCOSE 101* 82 133* 67*  BUN 17 21 22 16     CREATININE 1.24 1.37* 1.38* 1.41*  CALCIUM 8.8 8.6 9.3 --  MG -- -- -- --  PHOS -- -- -- --    Liver Function Tests:  Lab 04/13/12 0615 04/12/12 0020  AST 54* 56*  ALT 44 48  ALKPHOS 162* 196*  BILITOT 1.2 2.6*  PROT 5.0* 5.9*  ALBUMIN 2.4* 2.9*   No results found for this basename: LIPASE:5,AMYLASE:5 in the last 168 hours No results found for this basename: AMMONIA:3 in the last 168 hours  CBC:  Lab 04/14/12 0540 04/13/12 0615 04/12/12 0020 04/11/12 1211  WBC 6.0 7.8 8.4 10.5  NEUTROABS -- -- -- 8.3*  HGB 14.8 14.5 15.3 16.3  HCT 44.8 43.5 43.9 47.8  MCV 89.4 88.4 87.1 89.2  PLT 259 244 306 322    Cardiac Enzymes:  Lab 04/12/12 0829 04/12/12 0020 04/11/12 1716  CKTOTAL 66 78 99  CKMB 2.2 2.5 2.6  CKMBINDEX -- -- --  TROPONINI <0.30 <0.30 <0.30    BNP: BNP (last 3 results)  Basename 04/11/12 1211 03/19/12 1120 02/11/12 1014  PROBNP 5087.0* 2723.0* 3701.0*     Other results:  EKG:   Imaging: Dg Chest 2 View  04/13/2012  *RADIOLOGY REPORT*  Clinical Data: Shortness of breath, fever, follow-up pulmonary edema  CHEST - 2 VIEW  Comparison: 04/11/2012  Findings: Borderline enlargement of cardiac silhouette. Slight pulmonary  vascular congestion. Mediastinal contours stable. Peribronchial thickening with decrease in left perihilar markings question improving infiltrate. Remaining lungs clear. No pleural effusion or pneumothorax. No acute osseous findings.  IMPRESSION: Minimal bronchitic changes. Decrease in left perihilar markings question improving infiltrate in left upper lobe.  Original Report Authenticated By: Lollie Marrow, M.D.     Medications:     Scheduled Medications:    . aspirin EC  81 mg Oral Daily  . carvedilol  6.25 mg Oral BID WC  . furosemide  80 mg Intravenous BID  . heparin  5,000 Units Subcutaneous Q8H  . lisinopril  5 mg Oral BID  . metolazone  2.5 mg Oral BID  . potassium chloride  20 mEq Oral BID  . potassium chloride  40 mEq Oral  Once  . sodium chloride  3 mL Intravenous Q12H  . spironolactone  12.5 mg Oral Daily  . DISCONTD: metolazone  2.5 mg Oral Daily    Infusions:    PRN Medications: sodium chloride, acetaminophen, sodium chloride   Assessment:   1) Acute systolic heart failure  2) Presumed NICM, EF 20%  3) Hypertension  4) Chronic renal failure, baseline Cr 1.3-1.5  5) Alcohol use  6) Obesity s/p 40 pound weight loss  7) h/o tobacco use now quit  8) Stress echo 2011 - normal EF no ischemia 9) PVCs  Plan/Discussion:     Length of Stay: 4   Robbi Garter, [facility] 04/15/2012, 6:37 AM  Attending: Patient seen and examined with Ulyess Blossom, PA-C. We discussed all aspects of the encounter. I agree with the assessment and plan as stated above.   Down 5L but neck veins still up and he is orthopneic. Creatinine slightly worse. Will plan L and RHC today to evaluate for low output. Will use minimal contrast.   Sigmund Morera,MD 8:02 AM

## 2012-04-15 NOTE — Progress Notes (Signed)
Advanced Heart Failure Rounding Note   Subjective:    41 year old gentlemen with hypertension and newly diagnosed systolic heart failure, EF 20%.  History of alcohol abuse.   Metolazone 2.5 mg BID added to IV lasix 80 mg BID yesterday with marked improvement in UOP (-5 liters).  Down 9 pounds since yesterday.  However says he still couldn't sleep due to orthopnea. No CP.   Objective:   Weight Range:  Vital Signs:   Temp:  [97.2 F (36.2 C)-98.2 F (36.8 C)] 98.2 F (36.8 C) (06/27 0513) Pulse Rate:  [72-78] 78  (06/27 0513) Resp:  [20] 20  (06/27 0513) BP: (105-135)/(70-94) 118/92 mmHg (06/27 0513) SpO2:  [99 %-100 %] 100 % (06/27 0513) Weight:  [95.8 kg (211 lb 3.2 oz)] 95.8 kg (211 lb 3.2 oz) (06/27 0513) Last BM Date: 04/14/12  Weight change: Filed Weights   04/13/12 0500 04/14/12 0619 04/15/12 0513  Weight: 98.839 kg (217 lb 14.4 oz) 100.1 kg (220 lb 10.9 oz) 95.8 kg (211 lb 3.2 oz)    Intake/Output:   Intake/Output Summary (Last 24 hours) at 04/15/12 0637 Last data filed at 04/15/12 0518  Gross per 24 hour  Intake    480 ml  Output   5500 ml  Net  -5020 ml     Physical Exam: General:  Well appearing. No resp difficulty HEENT: normal Neck: supple. JVP to ear. Carotids 2+ bilat; no bruits. No lymphadenopathy or thryomegaly appreciated. Cor: PMI nondisplaced. Regular rate & rhythm. Soft TR Lungs: bibasilar crackles, R>L Abdomen: soft, nontender, nondistended. No hepatosplenomegaly. No bruits or masses. Good bowel sounds. Extremities: no cyanosis, clubbing, rash, trace L ankle edema Neuro: alert & orientedx3, cranial nerves grossly intact. moves all 4 extremities w/o difficulty. Affect pleasant  Telemetry: NSR with PVC and occ trigeminy   Labs: Basic Metabolic Panel:  Lab 04/14/12 0540 04/13/12 0615 04/12/12 0020 04/11/12 1211  NA 139 135 133* 134*  K 3.6 3.5 3.9 4.9  CL 100 97 95* 96  CO2 30 28 27 20  GLUCOSE 101* 82 133* 67*  BUN 17 21 22 16    CREATININE 1.24 1.37* 1.38* 1.41*  CALCIUM 8.8 8.6 9.3 --  MG -- -- -- --  PHOS -- -- -- --    Liver Function Tests:  Lab 04/13/12 0615 04/12/12 0020  AST 54* 56*  ALT 44 48  ALKPHOS 162* 196*  BILITOT 1.2 2.6*  PROT 5.0* 5.9*  ALBUMIN 2.4* 2.9*   No results found for this basename: LIPASE:5,AMYLASE:5 in the last 168 hours No results found for this basename: AMMONIA:3 in the last 168 hours  CBC:  Lab 04/14/12 0540 04/13/12 0615 04/12/12 0020 04/11/12 1211  WBC 6.0 7.8 8.4 10.5  NEUTROABS -- -- -- 8.3*  HGB 14.8 14.5 15.3 16.3  HCT 44.8 43.5 43.9 47.8  MCV 89.4 88.4 87.1 89.2  PLT 259 244 306 322    Cardiac Enzymes:  Lab 04/12/12 0829 04/12/12 0020 04/11/12 1716  CKTOTAL 66 78 99  CKMB 2.2 2.5 2.6  CKMBINDEX -- -- --  TROPONINI <0.30 <0.30 <0.30    BNP: BNP (last 3 results)  Basename 04/11/12 1211 03/19/12 1120 02/11/12 1014  PROBNP 5087.0* 2723.0* 3701.0*     Other results:  EKG:   Imaging: Dg Chest 2 View  04/13/2012  *RADIOLOGY REPORT*  Clinical Data: Shortness of breath, fever, follow-up pulmonary edema  CHEST - 2 VIEW  Comparison: 04/11/2012  Findings: Borderline enlargement of cardiac silhouette. Slight pulmonary   vascular congestion. Mediastinal contours stable. Peribronchial thickening with decrease in left perihilar markings question improving infiltrate. Remaining lungs clear. No pleural effusion or pneumothorax. No acute osseous findings.  IMPRESSION: Minimal bronchitic changes. Decrease in left perihilar markings question improving infiltrate in left upper lobe.  Original Report Authenticated By: MARK A. BOLES, M.D.     Medications:     Scheduled Medications:    . aspirin EC  81 mg Oral Daily  . carvedilol  6.25 mg Oral BID WC  . furosemide  80 mg Intravenous BID  . heparin  5,000 Units Subcutaneous Q8H  . lisinopril  5 mg Oral BID  . metolazone  2.5 mg Oral BID  . potassium chloride  20 mEq Oral BID  . potassium chloride  40 mEq Oral  Once  . sodium chloride  3 mL Intravenous Q12H  . spironolactone  12.5 mg Oral Daily  . DISCONTD: metolazone  2.5 mg Oral Daily    Infusions:    PRN Medications: sodium chloride, acetaminophen, sodium chloride   Assessment:   1) Acute systolic heart failure  2) Presumed NICM, EF 20%  3) Hypertension  4) Chronic renal failure, baseline Cr 1.3-1.5  5) Alcohol use  6) Obesity s/p 40 pound weight loss  7) h/o tobacco use now quit  8) Stress echo 2011 - normal EF no ischemia 9) PVCs  Plan/Discussion:     Length of Stay: 4   Robin Bradley, PAC 04/15/2012, 6:37 AM  Attending: Patient seen and examined with Nicki Bradley, PA-C. We discussed all aspects of the encounter. I agree with the assessment and plan as stated above.   Down 5L but neck veins still up and he is orthopneic. Creatinine slightly worse. Will plan L and RHC today to evaluate for low output. Will use minimal contrast.   Oseias Horsey,MD 8:02 AM     

## 2012-04-15 NOTE — Progress Notes (Signed)
Co sign for assessments, IV charting, and meds for Milta Deiters RN [phone].

## 2012-04-15 NOTE — Progress Notes (Signed)
Pt has signed consent form and form placed in chart.

## 2012-04-15 NOTE — Interval H&P Note (Signed)
History and Physical Interval Note:  04/15/2012 10:31 AM  Jeffrey Murray  has presented today for surgery, with the diagnosis of HF  The various methods of treatment have been discussed with the patient and family. After consideration of risks, benefits and other options for treatment, the patient has consented to  Procedure(s) (LRB): LEFT AND RIGHT HEART CATHETERIZATION WITH CORONARY ANGIOGRAM (N/A) as a surgical intervention .  The patient's history has been reviewed, patient examined, no change in status, stable for surgery.  I have reviewed the patients' chart and labs.  Questions were answered to the patient's satisfaction.     Undine Nealis

## 2012-04-15 NOTE — CV Procedure (Signed)
Cardiac Cath Procedure Note  Indication:   Procedures performed:  1) Right heart cathererization 2) Selective coronary angiography 3) Left heart catheterization   Description of procedure:     The risks and indication of the procedure were explained. Consent was signed and placed on the chart. An appropriate timeout was taken prior to the procedure. The right groin was prepped and draped in the routine sterile fashion and anesthetized with 1% local lidocaine.   A 5 FR arterial sheath was placed in the right femoral artery using a modified Seldinger technique. Standard catheters including a JL4, JR4 and angled pigtail were used. All catheter exchanges were made over a wire. A 7 FR venous sheath was placed in the right femoral vein using a modified Seldinger technique. A standard Swan-Ganz catheter was used for the procedure.   Complications:  None apparent  Findings:  RA = 21 (steep y-descents) RV = 47/8/21 PA =  45/20 (33) PCW = 28  Fick cardiac output/index = 3.1/1.4 PVR = 1.5 FA sat = 93% PA sat = 49%, 46% SVR  =  2037 dynes  Ao Pressure: 109/91 (100) LV Pressure:  114/22/31 There was no signficant gradient across the aortic valve on pullback.  No LV/RV interaction.  Left main: Normal  LAD: Normal. Wraps apex. Small bridge in midsection  LCX: Dominant. Minimal plaque  RCA: Tiny. nondominant   Assessment:  1. Normal coronary arteries 2. Severe NICM 3. Markedly elevated filling pressures with low cardiac output c/w cardiogenic shock   Plan/Discussion:  He has severe HF due to NICM likely due to longstanding HTN with probable component of restrictive physiology. Will transfer to stepdown. Start milrinone. Will need aggressive titration of afterload reducers - given his race and CRI, I favor hydralazine/nitrates. Will hold b-blocker for now. Given frequent PVCs and severe LV dysfunction will place LifeVest prior to D/C. Place PICC to follow CVP and co-ox.    Arvilla Meres, MD 10:53 AM

## 2012-04-15 NOTE — Progress Notes (Signed)
Peripherally Inserted Central Catheter/Midline Placement  The IV Nurse has discussed with the patient and/or persons authorized to consent for the patient, the purpose of this procedure and the potential benefits and risks involved with this procedure.  The benefits include less needle sticks, lab draws from the catheter and patient may be discharged home with the catheter.  Risks include, but not limited to, infection, bleeding, blood clot (thrombus formation), and puncture of an artery; nerve damage and irregular heat beat.  Alternatives to this procedure were also discussed.  PICC/Midline Placement Documentation        Lisabeth Devoid 04/15/2012, 3:56 PM Consent obtained by Merleen Milliner, RN, CRNI

## 2012-04-15 NOTE — Progress Notes (Signed)
Pt with 1 episode of ventricular bigeminy. Pt observed to be sleeping comfortably at this time. BMET and Mag level already ordered for this AM. Will continue to monitor.

## 2012-04-16 DIAGNOSIS — I10 Essential (primary) hypertension: Secondary | ICD-10-CM

## 2012-04-16 DIAGNOSIS — N189 Chronic kidney disease, unspecified: Secondary | ICD-10-CM

## 2012-04-16 DIAGNOSIS — I5021 Acute systolic (congestive) heart failure: Secondary | ICD-10-CM

## 2012-04-16 LAB — CARBOXYHEMOGLOBIN
Carboxyhemoglobin: 1.1 % (ref 0.5–1.5)
Methemoglobin: 0.8 % (ref 0.0–1.5)
O2 Saturation: 92.9 %
Total hemoglobin: 16 g/dL (ref 13.5–18.0)

## 2012-04-16 LAB — BASIC METABOLIC PANEL
CO2: 22 mEq/L (ref 19–32)
Calcium: 8 mg/dL — ABNORMAL LOW (ref 8.4–10.5)
Chloride: 99 mEq/L (ref 96–112)
Glucose, Bld: 114 mg/dL — ABNORMAL HIGH (ref 70–99)
Potassium: 3.3 mEq/L — ABNORMAL LOW (ref 3.5–5.1)
Sodium: 133 mEq/L — ABNORMAL LOW (ref 135–145)

## 2012-04-16 MED ORDER — FUROSEMIDE 10 MG/ML IJ SOLN
40.0000 mg | Freq: Two times a day (BID) | INTRAMUSCULAR | Status: DC
  Administered 2012-04-16 – 2012-04-17 (×2): 40 mg via INTRAVENOUS
  Filled 2012-04-16 (×4): qty 4

## 2012-04-16 MED ORDER — ISOSORBIDE MONONITRATE ER 30 MG PO TB24
30.0000 mg | ORAL_TABLET | Freq: Every day | ORAL | Status: DC
  Administered 2012-04-16 – 2012-04-19 (×4): 30 mg via ORAL
  Filled 2012-04-16 (×4): qty 1

## 2012-04-16 MED ORDER — POTASSIUM CHLORIDE CRYS ER 20 MEQ PO TBCR
40.0000 meq | EXTENDED_RELEASE_TABLET | Freq: Two times a day (BID) | ORAL | Status: DC
  Administered 2012-04-16 – 2012-04-17 (×4): 40 meq via ORAL
  Filled 2012-04-16 (×3): qty 2

## 2012-04-16 MED ORDER — HYDRALAZINE HCL 25 MG PO TABS
12.5000 mg | ORAL_TABLET | Freq: Three times a day (TID) | ORAL | Status: DC
  Administered 2012-04-16: 12.5 mg via ORAL
  Filled 2012-04-16 (×4): qty 0.5

## 2012-04-16 MED ORDER — HYDRALAZINE HCL 25 MG PO TABS
25.0000 mg | ORAL_TABLET | Freq: Three times a day (TID) | ORAL | Status: DC
  Administered 2012-04-16 – 2012-04-17 (×5): 25 mg via ORAL
  Filled 2012-04-16 (×6): qty 1

## 2012-04-16 NOTE — Care Management Note (Addendum)
    Page 1 of 1   04/20/2012     9:15:45 AM   CARE MANAGEMENT NOTE 04/20/2012  Patient:  [facility]   Account Number:  [id number]  Date Initiated:  04/15/2012  Documentation initiated by:  Junius Creamer  Subjective/Objective Assessment:   adm w cardiomyopathy     Action/Plan:   lives alone   Anticipated DC Date:  04/20/2012   Anticipated DC Plan:  HOME/SELF CARE      DC Planning Services  CM consult  [facility]igent [facility]  Medication Assistance  HF Clinic  [facility] Program Scales      [facility] Choice  DURABLE MEDICAL EQUIPMENT   Choice offered to / List presented to:     DME arranged  OTHER - SEE COMMENT           Status of service:   Medicare Important Message given?   (If response is "NO", the following Medicare IM given date fields will be blank) Date Medicare IM given:   Date Additional Medicare IM given:    Discharge Disposition:  HOME/SELF CARE  Per UR Regulation:  Reviewed for med. necessity/level of care/duration of stay  If discussed at Long Length of Stay Meetings, dates discussed:    Comments:  7/2 9:13a debbie Shawnita Krizek rn,bsn pt for dc home today. gave pt form for free scales. nse to get someone pick up for pt. he will follow up at [facility]. for lifevest placemnt prior to disch.  7/1 14:40p debbie Carlynn Leduc rn,bsn [phone] still on iv milrinone drip.  6/28 9:30a debbie Niobe Dick rn,bsn spoke w pt. gave him inform on [facility]. checked w [facility] and they still are not taking new pt's. have left message w [facility] to see if they will consider pt. [facility] not even accepting new clients til after august. gave pt community assist card to help w copays for brand name meds. have placed [facility] lifevest form and chf medication form on chart for md to sign. pt in agreement w above plans.  6/27 13:36p debbie Monnie Anspach rn,bsn [phone] transf to sdu today.

## 2012-04-16 NOTE — Progress Notes (Signed)
CARDIAC REHAB PHASE I   PRE:  Rate/Rhythm: 86 SR  BP:  Supine: 113/90  Sitting:   Standing:    SaO2: 96 RA  MODE:  Ambulation: 350 ft   POST:  Rate/Rhythem: 95 SR PVC  BP:  Supine:   Sitting: 135/105  Standing:    SaO2: 96 RA 0920-1000 Tolerated ambulation well without c/o of cp or SOB. Pt states that his legs feel weak from being in bed. BP after walk 135/105. Pt  To recliner after walk with call light in reach. Stated CHF education with pt. Gave him CHF packet and got CHF video for him to watch.  Jeffrey Murray

## 2012-04-16 NOTE — Progress Notes (Addendum)
Advanced Heart Failure Rounding Note   Subjective:    41 year old gentlemen with severe untreated hypertension, ETOH use and newly diagnosed systolic heart failure, EF 20%.   04/15/12 RHC/LHC  RA = 21 (steep y-descents)  RV = 47/8/21  PA = 45/20 (33)  PCW = 28  Fick cardiac output/index = 3.1/1.4  PVR = 1.5  FA sat = 93%  PA sat = 49%, 46%  SVR = 2037 dynes  Ao Pressure: 109/91 (100)  LV Pressure: 114/22/31 1. Normal coronary arteries 2. Severe NICM 3. Markedly elevated filling pressures with low cardiac output c/w cardiogenic shock  Yesterday, Milrinone initiated at 0.25 mcg/ kg/min due to low output.  Weight down 19 pounds since admit. 24 hour I/O = 5.9 liters.  CVP 10  Feels better. Denies SOB/PND/Orthopnea.   Creatinine >1.46>1.41>1.35 CO-OX 46 -> 92% (inaccurate)  Objective:   Weight Range:  Vital Signs:   Temp:  [97.4 F (36.3 C)-98.7 F (37.1 C)] 98.7 F (37.1 C) (06/28 0338) Pulse Rate:  [26-84] 75  (06/28 0340) Resp:  [16-26] 19  (06/27 2300) BP: (103-136)/(68-105) 133/84 mmHg (06/28 0340) SpO2:  [96 %-100 %] 97 % (06/28 0340) Weight:  [91.9 kg (202 lb 9.6 oz)] 91.9 kg (202 lb 9.6 oz) (06/28 0500) Last BM Date: 04/14/12  Weight change: Filed Weights   04/14/12 0619 04/15/12 0513 04/16/12 0500  Weight: 100.1 kg (220 lb 10.9 oz) 95.8 kg (211 lb 3.2 oz) 91.9 kg (202 lb 9.6 oz)    Intake/Output:   Intake/Output Summary (Last 24 hours) at 04/16/12 0726 Last data filed at 04/16/12 0600  Gross per 24 hour  Intake 1013.52 ml  Output   7000 ml  Net -5986.48 ml     Physical Exam: CVP 10 General:  Well appearing. No resp difficulty HEENT: normal Neck: supple. JVP jaw Carotids 2+ bilat; no bruits. No lymphadenopathy or thryomegaly appreciated. Cor: PMI nondisplaced. Regular rate & rhythm. Soft TR Lungs: Rhonchi RML RLL and LLL Abdomen: soft, nontender, nondistended. No hepatosplenomegaly. No bruits or masses. Good bowel sounds. Extremities: no cyanosis,  clubbing, rash, trace L ankle edema Neuro: alert & orientedx3, cranial nerves grossly intact. moves all 4 extremities w/o difficulty. Affect pleasant  Telemetry: NSR with PVC and occ trigeminy   Labs: Basic Metabolic Panel:  Lab 04/16/12 1610 04/15/12 1425 04/15/12 0500 04/14/12 0540 04/13/12 0615 04/12/12 0020  NA 133* -- 139 139 135 133*  K 3.3* -- 3.7 3.6 3.5 3.9  CL 99 -- 99 100 97 95*  CO2 22 -- 31 30 28 27   GLUCOSE 114* -- 92 101* 82 133*  BUN 12 -- 21 17 21 22   CREATININE 1.35 1.41* 1.46* 1.24 1.37* --  CALCIUM 8.0* -- 9.2 8.8 -- --  MG 1.6 -- 1.8 -- -- --  PHOS -- -- -- -- -- --    Liver Function Tests:  Lab 04/13/12 0615 04/12/12 0020  AST 54* 56*  ALT 44 48  ALKPHOS 162* 196*  BILITOT 1.2 2.6*  PROT 5.0* 5.9*  ALBUMIN 2.4* 2.9*   No results found for this basename: LIPASE:5,AMYLASE:5 in the last 168 hours No results found for this basename: AMMONIA:3 in the last 168 hours  CBC:  Lab 04/15/12 1425 04/14/12 0540 04/13/12 0615 04/12/12 0020 04/11/12 1211  WBC 5.0 6.0 7.8 8.4 10.5  NEUTROABS -- -- -- -- 8.3*  HGB 14.9 14.8 14.5 15.3 16.3  HCT 42.9 44.8 43.5 43.9 47.8  MCV 88.5 89.4 88.4 87.1 89.2  PLT 259 259 244 306 322    Cardiac Enzymes:  Lab 04/12/12 0829 04/12/12 0020 04/11/12 1716  CKTOTAL 66 78 99  CKMB 2.2 2.5 2.6  CKMBINDEX -- -- --  TROPONINI <0.30 <0.30 <0.30    BNP: BNP (last 3 results)  Basename 04/11/12 1211 03/19/12 1120 02/11/12 1014  PROBNP 5087.0* 2723.0* 3701.0*     Other results:    Imaging: No results found.   Medications:     Scheduled Medications:    . aspirin      . aspirin  324 mg Oral Pre-Cath  . aspirin EC  81 mg Oral Daily  . digoxin  0.125 mg Oral Daily  . enoxaparin  40 mg Subcutaneous Q24H  . fentaNYL      . furosemide  80 mg Intravenous BID  . heparin      . lidocaine      . lisinopril  5 mg Oral BID  . magnesium sulfate 1 - 4 g bolus IVPB  2 g Intravenous Once  . midazolam      . milrinone   0.25 mcg/kg/min Intravenous To Cath  . nitroGLYCERIN      . potassium chloride  20 mEq Oral BID  . sodium chloride  10-40 mL Intracatheter Q12H  . sodium chloride  3 mL Intravenous Q12H  . spironolactone  12.5 mg Oral Daily  . DISCONTD: carvedilol  6.25 mg Oral BID WC  . DISCONTD: heparin  5,000 Units Subcutaneous Q8H  . DISCONTD: metolazone  2.5 mg Oral BID  . DISCONTD: milrinone  0.125 mcg/kg/min Intravenous To Cath  . DISCONTD: sodium chloride  3 mL Intravenous Q12H  . DISCONTD: sodium chloride  3 mL Intravenous Q12H    Infusions:    . milrinone 0.25 mcg/kg/min (04/15/12 2116)    PRN Medications: sodium chloride, acetaminophen, ondansetron (ZOFRAN) IV, sodium chloride, sodium chloride, DISCONTD: sodium chloride, DISCONTD: sodium chloride, DISCONTD: acetaminophen, DISCONTD: sodium chloride, DISCONTD: sodium chloride   Assessment:   1) Acute systolic heart failure  2) Presumed NICM, EF 20%  3) Hypertension  4) Chronic renal failure, baseline Cr 1.3-1.5  5) Alcohol use  6) Obesity s/p 40 pound weight loss  7) h/o tobacco use now quit  8) Stress echo 2011 - normal EF no ischemia 9) PVCs 10) Hypokalemia.  Plan/Discussion:   Volume status improving. Diuresed> 5 liters. Weight down 19 pounds. Continue Milrionone 0.25 mcg/kg/min. CO-OX improved.CVP 10.  Start Hydralazine 12.5 mg TID for afterload reduction. Hold off on beta blocker.Creatinine improving. Replace potassium.    Discussed the need to lifevest upon discharge and he is agreeable. Will order for discharge.   Discussed the need for daily weights, medications compliance, low salt diet, and limiting fluid intake to less than 2 liters.   Length of Stay: 5 CLEGG,AMY, NP_C 04/16/2012, 7:26 AM  Patient seen and examined with Tonye Becket, NP. We discussed all aspects of the encounter. I agree with the assessment and plan as stated above.   Much improved with milrinone. Getting close to euvolemia. Co-ox much improved. Would  continue milrinone over the weekend (can start wean on Sunday if ready) - keep in stepdown. Likely switch to po lasix tomorrow (40 mg po daily). Needs aggressive afterload reduction - will titrate hydralazine/NTG aggressively. Consider starting carvedilol 3.125mg  bid on Sunday. Supp K+.  LifeVest ordered. Discussed situation at length with patient.   Pierre Dellarocco,MD 8:54 AM

## 2012-04-17 DIAGNOSIS — I10 Essential (primary) hypertension: Secondary | ICD-10-CM

## 2012-04-17 LAB — BASIC METABOLIC PANEL
Calcium: 9.1 mg/dL (ref 8.4–10.5)
GFR calc Af Amer: >90 mL/min (ref >90)
GFR calc non Af Amer: 83 mL/min — ABNORMAL LOW (ref >90)
Sodium: 137 mEq/L (ref 135–145)

## 2012-04-17 LAB — CARBOXYHEMOGLOBIN
O2 Saturation: 59.3 %
Total hemoglobin: 18.9 g/dL — ABNORMAL HIGH (ref 13.5–18.0)

## 2012-04-17 MED ORDER — POTASSIUM CHLORIDE CRYS ER 20 MEQ PO TBCR
20.0000 meq | EXTENDED_RELEASE_TABLET | Freq: Two times a day (BID) | ORAL | Status: DC
  Administered 2012-04-18 – 2012-04-20 (×5): 20 meq via ORAL
  Filled 2012-04-17 (×6): qty 1

## 2012-04-17 MED ORDER — HYDRALAZINE HCL 25 MG PO TABS
37.5000 mg | ORAL_TABLET | Freq: Three times a day (TID) | ORAL | Status: DC
  Administered 2012-04-18 – 2012-04-19 (×4): 37.5 mg via ORAL
  Filled 2012-04-17 (×7): qty 1.5

## 2012-04-17 MED ORDER — FUROSEMIDE 40 MG PO TABS
40.0000 mg | ORAL_TABLET | Freq: Two times a day (BID) | ORAL | Status: DC
  Administered 2012-04-17 – 2012-04-18 (×2): 40 mg via ORAL
  Filled 2012-04-17 (×4): qty 1

## 2012-04-17 MED ORDER — SPIRONOLACTONE 25 MG PO TABS
25.0000 mg | ORAL_TABLET | Freq: Every day | ORAL | Status: DC
  Administered 2012-04-18 – 2012-04-20 (×3): 25 mg via ORAL
  Filled 2012-04-17 (×3): qty 1

## 2012-04-17 MED ORDER — TEMAZEPAM 15 MG PO CAPS
15.0000 mg | ORAL_CAPSULE | Freq: Every evening | ORAL | Status: DC | PRN
  Administered 2012-04-17: 15 mg via ORAL
  Filled 2012-04-17: qty 1

## 2012-04-17 NOTE — Progress Notes (Signed)
CARDIAC REHAB PHASE I   PRE:  Rate/Rhythm: 89 SR PVC's  BP:  Supine: 131/76  Sitting:   Standing:    SaO2: 97 RA  MODE:  Ambulation: 700 ft   POST:  Rate/Rhythem: 92 SR PVC's  BP:  Supine:   Sitting: 114/90  Standing:    SaO2: 97 RA 0825-0850 Tolerated ambulation well without c/o of cp or SOB. Pt did c/o of leg pain, stiffness with walking. BP after walk 114/90. Pt to recliner after walk with call light in reach. Put Lifevest video for pt to view.   Beatrix Fetters

## 2012-04-17 NOTE — Progress Notes (Signed)
PROGRESS NOTE  Subjective:   Jeffrey Murray is a 41 yo with CHF due to untreated HTN and ETOH abuse.  Cath 6/27 reveals normal coronary arteries.  LV EDP is 31.  He has continued to diurese briskly  -3.5 liters 6/28, - 6 liters on 6/27.  Wt. This am is 208.  Objective:    Vital Signs:   Temp:  [97.5 F (36.4 C)-98.8 F (37.1 C)] 98.8 F (37.1 C) (06/29 0802) Resp:  [16-20] 20  (06/29 0802) BP: (123-137)/(69-97) 131/76 mmHg (06/29 0747) SpO2:  [92 %-99 %] 96 % (06/29 0747) Weight:  [208 lb 15.9 oz (94.8 kg)] 208 lb 15.9 oz (94.8 kg) (06/29 0500)  Last BM Date: 04/16/12   24-hour weight change: Weight change: 6 lb 6.3 oz (2.9 kg)  Weight trends: Filed Weights   04/15/12 0513 04/16/12 0500 04/17/12 0500  Weight: 211 lb 3.2 oz (95.8 kg) 202 lb 9.6 oz (91.9 kg) 208 lb 15.9 oz (94.8 kg)    Intake/Output:  06/28 0701 - 06/29 0700 In: 1466.4 [P.O.:1170; I.V.:292.4; IV Piggyback:4] Out: 4925 [Urine:4925] Total I/O In: 17.2 [I.V.:17.2] Out: 400 [Urine:400]   Physical Exam: BP 131/76  Pulse 82  Temp 98.8 F (37.1 C) (Oral)  Resp 20  Ht 5\' 11"  (1.803 m)  Wt 208 lb 15.9 oz (94.8 kg)  BMI 29.15 kg/m2  SpO2 96%  General: Vital signs reviewed and noted. Well-developed, well-nourished, in no acute distress; alert, appropriate and cooperative .  Head: Normocephalic, atraumatic.  Eyes: conjunctivae/corneas clear.  EOM's intact.   Throat: normal  Neck: Supple. Normal carotids. No JVD  Lungs:  Clear to auscultation  Heart: Regular rate,  With normal  S1 S2. No murmurs, gallops or rubs  Abdomen:  Soft, non-tender, non-distended with normoactive bowel sounds. No hepatomegaly. No rebound/guarding. No abdominal masses.  Extremities: Distal pedal pulses are 2+ .  No edema.    Neurologic: A&O X3, CN II - XII are grossly intact. Motor strength is 5/5 in the all 4 extremities.  Psych: Responds to questions appropriately with normal affect.    Labs: BMET:  Basename 04/17/12 0355  04/16/12 0500 04/15/12 0500  NA 137 133* --  K 3.9 3.3* --  CL 96 99 --  CO2 29 22 --  GLUCOSE 154* 114* --  BUN 18 12 --  CREATININE 1.08 1.35 --  CALCIUM 9.1 8.0* --  MG -- 1.6 1.8  PHOS -- -- --    Liver function tests: No results found for this basename: AST:2,ALT:2,ALKPHOS:2,BILITOT:2,PROT:2,ALBUMIN:2 in the last 72 hours No results found for this basename: LIPASE:2,AMYLASE:2 in the last 72 hours  CBC:  Basename 04/15/12 1425  WBC 5.0  NEUTROABS --  HGB 14.9  HCT 42.9  MCV 88.5  PLT 259    Cardiac Enzymes: No results found for this basename: CKTOTAL:4,CKMB:4,TROPONINI:4 in the last 72 hours  Coagulation Studies:  Basename 04/15/12 1308  LABPROT 15.1  INR 1.17      Tele:    Medications:    Infusions:    . milrinone 0.25 mcg/kg/min (04/16/12 2200)    Scheduled Medications:    . aspirin EC  81 mg Oral Daily  . digoxin  0.125 mg Oral Daily  . enoxaparin  40 mg Subcutaneous Q24H  . furosemide  40 mg Intravenous BID  . hydrALAZINE  25 mg Oral Q8H  . isosorbide mononitrate  30 mg Oral Daily  . lisinopril  5 mg Oral BID  . potassium chloride  40 mEq Oral  BID  . sodium chloride  10-40 mL Intracatheter Q12H  . sodium chloride  3 mL Intravenous Q12H  . spironolactone  12.5 mg Oral Daily    Assessment/ Plan:    1.  HTN    Continue current meds for today.  Add coreg tomorrow  2.  Acute systolic CHF (congestive heart failure) (04/13/2012) Due to untreated HTN and ETOH.  Cont. CHF meds. Add coreg tomorrow  3. Chronic kidney disease: du to HTN.   Disposition: keep in step down today.  Start coreg tomorrow.  Taper Milrinone tomorrow.  He's doin gvery well.   Length of Stay: 6  Vesta Mixer, Montez Hageman., MD, [facility] 04/17/2012, 10:39 AM Office [phone] Pager [phone]

## 2012-04-18 LAB — BASIC METABOLIC PANEL
CO2: 27 mEq/L (ref 19–32)
Creatinine, Ser: 1.1 mg/dL (ref 0.50–1.35)
GFR calc Af Amer: >90 mL/min (ref >90)

## 2012-04-18 LAB — CARBOXYHEMOGLOBIN
Carboxyhemoglobin: 0.7 % (ref 0.5–1.5)
Methemoglobin: 0.8 % (ref 0.0–1.5)
O2 Saturation: 68 %
Total hemoglobin: 18.9 g/dL — ABNORMAL HIGH (ref 13.5–18.0)

## 2012-04-18 MED ORDER — CARVEDILOL 3.125 MG PO TABS
3.1250 mg | ORAL_TABLET | Freq: Two times a day (BID) | ORAL | Status: DC
  Administered 2012-04-18 – 2012-04-20 (×5): 3.125 mg via ORAL
  Filled 2012-04-18 (×7): qty 1

## 2012-04-18 MED ORDER — HYDROCORTISONE 1 % EX CREA
TOPICAL_CREAM | Freq: Four times a day (QID) | CUTANEOUS | Status: DC | PRN
  Administered 2012-04-18: 07:00:00 via TOPICAL
  Filled 2012-04-18: qty 28

## 2012-04-18 MED ORDER — FUROSEMIDE 40 MG PO TABS
40.0000 mg | ORAL_TABLET | Freq: Every day | ORAL | Status: DC
  Administered 2012-04-19 – 2012-04-20 (×2): 40 mg via ORAL
  Filled 2012-04-18 (×2): qty 1

## 2012-04-18 NOTE — Progress Notes (Signed)
PROGRESS NOTE  Subjective:   Mr. Life is a 41 yo with CHF due to untreated HTN and ETOH abuse.  Cath 6/27 reveals normal coronary arteries.  LV EDP is 31.  He has continued to diurese briskly: - 6 liters on 6/27, -3.5 liters 6/28, - 1.8 liters 6/29  Wt. This am is 196 lbs.  CVP is 7 Objective:    Vital Signs:   Temp:  [97.7 F (36.5 C)-98 F (36.7 C)] 97.9 F (36.6 C) (06/30 0753) Pulse Rate:  [75] 75  (06/29 2330) Resp:  [18] 18  (06/30 0753) BP: (113-132)/(35-81) 127/76 mmHg (06/30 0522) SpO2:  [96 %-97 %] 97 % (06/30 0753) Weight:  [196 lb 3.4 oz (89 kg)] 196 lb 3.4 oz (89 kg) (06/30 0400)  Last BM Date: 04/16/12   24-hour weight change: Weight change: -12 lb 12.6 oz (-5.8 kg)  Weight trends: Filed Weights   04/16/12 0500 04/17/12 0500 04/18/12 0400  Weight: 202 lb 9.6 oz (91.9 kg) 208 lb 15.9 oz (94.8 kg) 196 lb 3.4 oz (89 kg)    Intake/Output:  06/29 0701 - 06/30 0700 In: 1618.4 [P.O.:1240; I.V.:378.4] Out: 3500 [Urine:3500] Total I/O In: 334.4 [P.O.:300; I.V.:34.4] Out: 500 [Urine:500]   Physical Exam: BP 127/76  Pulse 75  Temp 97.9 F (36.6 C) (Oral)  Resp 18  Ht 5\' 11"  (1.803 m)  Wt 196 lb 3.4 oz (89 kg)  BMI 27.37 kg/m2  SpO2 97%  General: Vital signs reviewed and noted. Well-developed, well-nourished, in no acute distress; alert, appropriate and cooperative .  Head: Normocephalic, atraumatic.  Eyes: conjunctivae/corneas clear.  EOM's intact.   Throat: normal  Neck: Supple. Normal carotids. No JVD  Lungs:  Clear to auscultation  Heart: Regular rate,  With normal  S1 S2. No murmurs, gallops or rubs  Abdomen:  Soft, non-tender, non-distended with normoactive bowel sounds. No hepatomegaly. No rebound/guarding. No abdominal masses.  Extremities: Distal pedal pulses are 2+ .  No edema.    Neurologic: A&O X3, CN II - XII are grossly intact. Motor strength is 5/5 in the all 4 extremities.  Psych: Responds to questions appropriately with normal  affect.    Labs: BMET:  Basename 04/18/12 0425 04/17/12 0355 04/16/12 0500  NA 135 137 --  K 4.4 3.9 --  CL 97 96 --  CO2 27 29 --  GLUCOSE 145* 154* --  BUN 16 18 --  CREATININE 1.10 1.08 --  CALCIUM 9.6 9.1 --  MG -- -- 1.6  PHOS -- -- --    Liver function tests: No results found for this basename: AST:2,ALT:2,ALKPHOS:2,BILITOT:2,PROT:2,ALBUMIN:2 in the last 72 hours No results found for this basename: LIPASE:2,AMYLASE:2 in the last 72 hours  CBC:  Basename 04/15/12 1425  WBC 5.0  NEUTROABS --  HGB 14.9  HCT 42.9  MCV 88.5  PLT 259    Cardiac Enzymes: No results found for this basename: CKTOTAL:4,CKMB:4,TROPONINI:4 in the last 72 hours  Coagulation Studies:  Basename 04/15/12 1308  LABPROT 15.1  INR 1.17      Tele:    Medications:    Infusions:    . milrinone 0.25 mcg/kg/min (04/18/12 0407)    Scheduled Medications:    . aspirin EC  81 mg Oral Daily  . digoxin  0.125 mg Oral Daily  . enoxaparin  40 mg Subcutaneous Q24H  . furosemide  40 mg Oral BID  . hydrALAZINE  37.5 mg Oral Q8H  . isosorbide mononitrate  30 mg Oral Daily  .  lisinopril  5 mg Oral BID  . potassium chloride  20 mEq Oral BID  . sodium chloride  10-40 mL Intracatheter Q12H  . sodium chloride  3 mL Intravenous Q12H  . spironolactone  25 mg Oral Daily  . DISCONTD: furosemide  40 mg Intravenous BID  . DISCONTD: hydrALAZINE  25 mg Oral Q8H  . DISCONTD: potassium chloride  40 mEq Oral BID  . DISCONTD: spironolactone  12.5 mg Oral Daily    Assessment/ Plan:    1.  HTN    Continue current meds for today.  Add coreg.  We may need to back off the Hydralazine if he becomes hypotensive.  2.  Acute systolic CHF (congestive heart failure) (04/13/2012) Due to untreated HTN and ETOH.  Cont. CHF meds. Add coreg tomorrow.  Decrease milrinone to .125 .  3. Chronic kidney disease: du to HTN.   Disposition: keep in step down today.  Start coreg .  Taper Milrinone.  He's doing very  well.   Length of Stay: 7  Vesta Mixer, Montez Hageman., MD, [facility] 04/18/2012, 9:30 AM Office [phone] Pager [phone]

## 2012-04-18 NOTE — Progress Notes (Signed)
Notified MD per pt's request for sleep medication. VS stable will continue to monitor.  Emilie [place] Liter

## 2012-04-19 LAB — BASIC METABOLIC PANEL
CO2: 23 mEq/L (ref 19–32)
Calcium: 9.6 mg/dL (ref 8.4–10.5)
Creatinine, Ser: 0.96 mg/dL (ref 0.50–1.35)
GFR calc non Af Amer: >90 mL/min (ref >90)

## 2012-04-19 LAB — CARBOXYHEMOGLOBIN: Total hemoglobin: 18.6 g/dL — ABNORMAL HIGH (ref 13.5–18.0)

## 2012-04-19 MED ORDER — HYDRALAZINE HCL 50 MG PO TABS
50.0000 mg | ORAL_TABLET | Freq: Three times a day (TID) | ORAL | Status: DC
  Administered 2012-04-19 – 2012-04-20 (×3): 50 mg via ORAL
  Filled 2012-04-19 (×6): qty 1

## 2012-04-19 MED ORDER — ISOSORBIDE MONONITRATE ER 30 MG PO TB24
30.0000 mg | ORAL_TABLET | Freq: Two times a day (BID) | ORAL | Status: DC
  Administered 2012-04-19 – 2012-04-20 (×2): 30 mg via ORAL
  Filled 2012-04-19 (×3): qty 1

## 2012-04-19 MED ORDER — HYDRALAZINE HCL 25 MG PO TABS
12.5000 mg | ORAL_TABLET | Freq: Once | ORAL | Status: AC
  Administered 2012-04-19: 12.5 mg via ORAL
  Filled 2012-04-19: qty 0.5

## 2012-04-19 NOTE — Discharge Instructions (Signed)
Heart Failure Heart failure (HF) is a condition in which the heart has trouble pumping blood. This means your heart does not pump blood efficiently for your body to work well. In some cases of HF, fluid may back up into your lungs or you may have swelling (edema) in your lower legs. HF is a long-term (chronic) condition. It is important for you to take good care of yourself and follow your caregiver's treatment plan. CAUSES   Health conditions:   High blood pressure (hypertension) causes the heart muscle to work harder than normal. When pressure in the blood vessels is high, the heart needs to pump (contract) with more force in order to circulate blood throughout the body. High blood pressure eventually causes the heart to become stiff and weak.   Coronary artery disease (CAD) is the buildup of cholesterol and fat (plaques) in the arteries of the heart. The blockage in the arteries deprives the heart muscle of oxygen and blood. This can cause chest pain and may lead to a heart attack. High blood pressure can also contribute to CAD.   Heart attack (myocardial infarction) occurs when 1 or more arteries in the heart become blocked. The loss of oxygen damages the muscle tissue of the heart. When this happens, part of the heart muscle dies. The injured tissue does not contract as well and weakens the heart's ability to pump blood.   Abnormal heart valves can cause HF when the heart valves do not open and close properly. This makes the heart muscle pump harder to keep the blood flowing.   Heart muscle disease (cardiomyopathy or myocarditis) is damage to the heart muscle from a variety of causes. These can include drug or alcohol abuse, infections, or unknown reasons. These can increase the risk of HF.   Lung disease makes the heart work harder because the lungs do not work properly. This can cause a strain on the heart leading it to fail.   Diabetes increases the risk of HF. High blood sugar contributes  to high fat (lipid) levels in the blood. Diabetes can also cause slow damage to tiny blood vessels that carry important nutrients to the heart muscle. When the heart does not get enough oxygen and food, it can cause the heart to become weak and stiff. This leads to a heart that does not contract efficiently.   Other diseases can contribute to HF. These include abnormal heart rhythms, thyroid problems, and low blood counts (anemia).   Unhealthy lifestyle habits:   Obesity.   Smoking.   Eating foods high in fat and cholesterol.   Eating or drinking beverages high in salt.   Drug or alcohol abuse.   Lack of exercise.  SYMPTOMS  HF symptoms may vary and can be hard to detect. Symptoms may include:  Shortness of breath with activity, such as climbing stairs.   Persistent cough.   Swelling of the feet, ankles, legs, or abdomen.   Unexplained weight gain.   Difficulty breathing when lying flat.   Waking from sleep because of the need to sit up and get more air.   Rapid heartbeat.   Fatigue and loss of energy.   Feeling lightheaded or close to fainting.  DIAGNOSIS  A diagnosis of HF is based on your history, symptoms, physical examination, and diagnostic tests. Diagnostic tests for HF may include:  EKG.   Chest X-ray.   Blood tests.   Exercise stress test.   Blood oxygen test (arterial blood gas).   Evaluation   by a heart doctor (cardiologist).   Ultrasound evaluation of the heart (echocardiogram).   Heart artery test to look for blockages (angiogram).   Radioactive imaging to look at the heart (radionuclide test).  TREATMENT  Treatment is aimed at managing the symptoms of HF. Medicines, lifestyle changes, or surgical intervention may be necessary to treat HF.  Medicines to help treat HF may include:   Angiotensin-converting enzyme (ACE) inhibitors. These block the effects of a blood protein called angiotensin-converting enzyme. ACE inhibitors relax (dilate) the  blood vessels and help lower blood pressure. This decreases the workload of the heart, slows the progression of HF, and improves symptoms.   Angiotensin receptor blockers (ARBs). These medications work similar to ACE inhibitors. ARBs may be an alternative for people who cannot tolerate an ACE inhibitor.   Aldosterone antagonists. This medication helps get rid of extra fluid from your body. This lowers the volume of blood the heart has to pump.   Water pills (diuretics). Diuretics cause the kidneys to remove salt and water from the blood. The extra fluid is removed by urination. By removing extra fluid from the body, diuretics help lower the workload of the heart and help prevent fluid buildup in the lungs so breathing is easier.   Beta blockers. These prevent the heart from beating too fast and improve heart muscle strength. Beta blockers help maintain a normal heart rate, control blood pressure, and improve HF symptoms.   Digitalis. This increases the force of the heartbeat and may be helpful to people with HF or heart rhythm problems.   Healthy lifestyle changes include:   Stopping smoking.   Eating a healthy diet. Avoid foods high in fat. Avoid foods fried in oil or made with fat. A dietician can help with healthy food choices.   Limiting how much salt you eat.   Limiting alcohol intake to no more than 1 drink per day for women and 2 drinks per day for men. Drinking more than that is harmful to your heart. If your heart has already been damaged by alcohol or you have severe HF, drinking alcohol should be stopped completely.   Exercising as directed by your caregiver.   Surgical treatment for HF may include:   Procedures to open blocked arteries, repair damaged heart valves, or remove damaged heart muscle tissue.   A pacemaker to help heart muscle function and to control certain abnormal heart rhythms.   A defibrillator to possibly prevent sudden cardiac death.  HOME CARE  INSTRUCTIONS   Activity level. Your caregiver can help you determine what type of exercise program may be helpful. It is important to maintain your strength. Pace your physical activity to avoid shortness of breath or chest pain. Rest for 1 hour before and after meals. A cardiac rehabilitation program may be helpful to some people with HF.   Diet. Eat a heart healthy diet. Food choices should be low in saturated fat and cholesterol. Talk to a dietician to learn about heart healthy foods.   Salt intake. When you have HF, you need to limit the amount of salt you eat. Eat less than 1500 milligrams (mg) of salt per day or as recommended by your caregiver.   Weight monitoring. Weigh yourself every day. You should weigh yourself in the morning after you urinate and before you eat breakfast. Wear the same amount of clothing each time you weigh yourself. Record your weight daily. Bring your recorded weights to your clinic visits. Tell your caregiver right away if   you have gained 3 lb/1.4 kg in 1 day, or 5 lb/2.3 kg in a week or whatever amount you were told to report.   Blood pressure monitoring. This should be done as directed by your caregiver. A home blood pressure cuff can be purchased at a drugstore. Record your blood pressure numbers and bring them to your clinic visits. Tell your caregiver if you become dizzy or lightheaded upon standing up.   Smoking. If you are currently a smoker, it is time to quit. Nicotine makes your heart work harder by causing your blood vessels to constrict. Do not use nicotine gum or patches before talking to your caregiver.   Follow up. Be sure to schedule a follow-up visit with your caregiver. Keep all your appointments.  SEEK MEDICAL CARE IF:   Your weight increases by 3 lb/1.4 kg in 1 day or 5 lb/2.3 kg in a week.   You notice increasing shortness of breath that is unusual for you. This may happen during rest, sleep, or with activity.   You cough more than normal,  especially with physical activity.   You notice more swelling in your hands, feet, ankles, or belly (abdomen).   You are unable to sleep because it is hard to breathe.   You cough up bloody mucus (sputum).   You begin to feel "jumping" or "fluttering" sensations (palpitations) in your chest.  SEEK IMMEDIATE MEDICAL CARE IF:   You have severe chest pain or pressure which may include symptoms such as:   Pain or pressure in the arms, neck, jaw, or back.   Feeling sweaty.   Feeling sick to your stomach (nauseous).   Feeling short of breath while at rest.   Having a fast or irregular heartbeat.   You experience stroke symptoms. These symptoms include:   Facial weakness or numbness.   Weakness or numbness in an arm, leg, or on one side of your body.   Blurred vision.   Difficulty talking or thinking.   Dizziness or fainting.   Severe headache.  THESE ARE MEDICAL EMERGENCIES. Do not wait to see if the symptoms go away. Call your local emergency services (911 in [place] DO NOT drive yourself to the hospital. IMPORTANT  Make a list of every medicine, vitamin, or herbal supplement you are taking. Keep the list with you at all times. Show it to your caregiver at every visit. Keep the list up-to-date.   Ask your caregiver or pharmacist to write an explanation of each medicine you are taking. This should include:   Why you are taking it.   The possible side effects.   The best time of day to take it.   Foods to take with it or what foods to avoid.   When to stop taking it.  MAKE SURE YOU:   Understand these instructions.   Will watch your condition.   Will get help right away if you are not doing well or get worse.  Document Released: 10/06/2005 Document Revised: 09/25/2011 Document Reviewed: 01/18/2010 Heart Failure Heart failure (HF) is a condition in which the heart has trouble pumping blood. This means your heart does not pump blood efficiently for your body to work  well. In some cases of HF, fluid may back up into your lungs or you may have swelling (edema) in your lower legs. HF is a long-term (chronic) condition. It is important for you to take good care of yourself and follow your caregiver's treatment plan. CAUSES   Health conditions:  High blood pressure (hypertension) causes the heart muscle to work harder than normal. When pressure in the blood vessels is high, the heart needs to pump (contract) with more force in order to circulate blood throughout the body. High blood pressure eventually causes the heart to become stiff and weak.   Coronary artery disease (CAD) is the buildup of cholesterol and fat (plaques) in the arteries of the heart. The blockage in the arteries deprives the heart muscle of oxygen and blood. This can cause chest pain and may lead to a heart attack. High blood pressure can also contribute to CAD.   Heart attack (myocardial infarction) occurs when 1 or more arteries in the heart become blocked. The loss of oxygen damages the muscle tissue of the heart. When this happens, part of the heart muscle dies. The injured tissue does not contract as well and weakens the heart's ability to pump blood.   Abnormal heart valves can cause HF when the heart valves do not open and close properly. This makes the heart muscle pump harder to keep the blood flowing.   Heart muscle disease (cardiomyopathy or myocarditis) is damage to the heart muscle from a variety of causes. These can include drug or alcohol abuse, infections, or unknown reasons. These can increase the risk of HF.   Lung disease makes the heart work harder because the lungs do not work properly. This can cause a strain on the heart leading it to fail.   Diabetes increases the risk of HF. High blood sugar contributes to high fat (lipid) levels in the blood. Diabetes can also cause slow damage to tiny blood vessels that carry important nutrients to the heart muscle. When the heart does  not get enough oxygen and food, it can cause the heart to become weak and stiff. This leads to a heart that does not contract efficiently.   Other diseases can contribute to HF. These include abnormal heart rhythms, thyroid problems, and low blood counts (anemia).   Unhealthy lifestyle habits:   Obesity.   Smoking.   Eating foods high in fat and cholesterol.   Eating or drinking beverages high in salt.   Drug or alcohol abuse.   Lack of exercise.  SYMPTOMS  HF symptoms may vary and can be hard to detect. Symptoms may include:  Shortness of breath with activity, such as climbing stairs.   Persistent cough.   Swelling of the feet, ankles, legs, or abdomen.   Unexplained weight gain.   Difficulty breathing when lying flat.   Waking from sleep because of the need to sit up and get more air.   Rapid heartbeat.   Fatigue and loss of energy.   Feeling lightheaded or close to fainting.  DIAGNOSIS  A diagnosis of HF is based on your history, symptoms, physical examination, and diagnostic tests. Diagnostic tests for HF may include:  EKG.   Chest X-ray.   Blood tests.   Exercise stress test.   Blood oxygen test (arterial blood gas).   Evaluation by a heart doctor (cardiologist).   Ultrasound evaluation of the heart (echocardiogram).   Heart artery test to look for blockages (angiogram).   Radioactive imaging to look at the heart (radionuclide test).  TREATMENT  Treatment is aimed at managing the symptoms of HF. Medicines, lifestyle changes, or surgical intervention may be necessary to treat HF.  Medicines to help treat HF may include:   Angiotensin-converting enzyme (ACE) inhibitors. These block the effects of a blood protein called angiotensin-converting enzyme.  ACE inhibitors relax (dilate) the blood vessels and help lower blood pressure. This decreases the workload of the heart, slows the progression of HF, and improves symptoms.   Angiotensin receptor  blockers (ARBs). These medications work similar to ACE inhibitors. ARBs may be an alternative for people who cannot tolerate an ACE inhibitor.   Aldosterone antagonists. This medication helps get rid of extra fluid from your body. This lowers the volume of blood the heart has to pump.   Water pills (diuretics). Diuretics cause the kidneys to remove salt and water from the blood. The extra fluid is removed by urination. By removing extra fluid from the body, diuretics help lower the workload of the heart and help prevent fluid buildup in the lungs so breathing is easier.   Beta blockers. These prevent the heart from beating too fast and improve heart muscle strength. Beta blockers help maintain a normal heart rate, control blood pressure, and improve HF symptoms.   Digitalis. This increases the force of the heartbeat and may be helpful to people with HF or heart rhythm problems.   Healthy lifestyle changes include:   Stopping smoking.   Eating a healthy diet. Avoid foods high in fat. Avoid foods fried in oil or made with fat. A dietician can help with healthy food choices.   Limiting how much salt you eat.   Limiting alcohol intake to no more than 1 drink per day for women and 2 drinks per day for men. Drinking more than that is harmful to your heart. If your heart has already been damaged by alcohol or you have severe HF, drinking alcohol should be stopped completely.   Exercising as directed by your caregiver.   Surgical treatment for HF may include:   Procedures to open blocked arteries, repair damaged heart valves, or remove damaged heart muscle tissue.   A pacemaker to help heart muscle function and to control certain abnormal heart rhythms.   A defibrillator to possibly prevent sudden cardiac death.  HOME CARE INSTRUCTIONS   Activity level. Your caregiver can help you determine what type of exercise program may be helpful. It is important to maintain your strength. Pace your  physical activity to avoid shortness of breath or chest pain. Rest for 1 hour before and after meals. A cardiac rehabilitation program may be helpful to some people with HF.   Diet. Eat a heart healthy diet. Food choices should be low in saturated fat and cholesterol. Talk to a dietician to learn about heart healthy foods.   Salt intake. When you have HF, you need to limit the amount of salt you eat. Eat less than 1500 milligrams (mg) of salt per day or as recommended by your caregiver.   Weight monitoring. Weigh yourself every day. You should weigh yourself in the morning after you urinate and before you eat breakfast. Wear the same amount of clothing each time you weigh yourself. Record your weight daily. Bring your recorded weights to your clinic visits. Tell your caregiver right away if you have gained 3 lb/1.4 kg in 1 day, or 5 lb/2.3 kg in a week or whatever amount you were told to report.   Blood pressure monitoring. This should be done as directed by your caregiver. A home blood pressure cuff can be purchased at a drugstore. Record your blood pressure numbers and bring them to your clinic visits. Tell your caregiver if you become dizzy or lightheaded upon standing up.   Smoking. If you are currently a smoker, it  is time to quit. Nicotine makes your heart work harder by causing your blood vessels to constrict. Do not use nicotine gum or patches before talking to your caregiver.   Follow up. Be sure to schedule a follow-up visit with your caregiver. Keep all your appointments.  SEEK MEDICAL CARE IF:   Your weight increases by 3 lb/1.4 kg in 1 day or 5 lb/2.3 kg in a week.   You notice increasing shortness of breath that is unusual for you. This may happen during rest, sleep, or with activity.   You cough more than normal, especially with physical activity.   You notice more swelling in your hands, feet, ankles, or belly (abdomen).   You are unable to sleep because it is hard to breathe.     You cough up bloody mucus (sputum).   You begin to feel "jumping" or "fluttering" sensations (palpitations) in your chest.  SEEK IMMEDIATE MEDICAL CARE IF:   You have severe chest pain or pressure which may include symptoms such as:   Pain or pressure in the arms, neck, jaw, or back.   Feeling sweaty.   Feeling sick to your stomach (nauseous).   Feeling short of breath while at rest.   Having a fast or irregular heartbeat.   You experience stroke symptoms. These symptoms include:   Facial weakness or numbness.   Weakness or numbness in an arm, leg, or on one side of your body.   Blurred vision.   Difficulty talking or thinking.   Dizziness or fainting.   Severe headache.  THESE ARE MEDICAL EMERGENCIES. Do not wait to see if the symptoms go away. Call your local emergency services (911 in [place] DO NOT drive yourself to the hospital. IMPORTANT  Make a list of every medicine, vitamin, or herbal supplement you are taking. Keep the list with you at all times. Show it to your caregiver at every visit. Keep the list up-to-date.   Ask your caregiver or pharmacist to write an explanation of each medicine you are taking. This should include:   Why you are taking it.   The possible side effects.   The best time of day to take it.   Foods to take with it or what foods to avoid.   When to stop taking it.  MAKE SURE YOU:   Understand these instructions.   Will watch your condition.   Will get help right away if you are not doing well or get worse.  Document Released: 10/06/2005 Document Revised: 09/25/2011 Document Reviewed: 01/18/2010 [facility] Patient Information 2012 [place], [place].[employer] Patient Information 2012 [place], [place].

## 2012-04-19 NOTE — Progress Notes (Signed)
PROGRESS NOTE  Subjective:   Jeffrey Murray is a 41 yo with CHF due to untreated HTN and ETOH abuse.  Cath 6/27 reveals normal coronary arteries.  LV EDP is 31.   Yesterday,Milrinone decreased 0.125 mcg, Hyralazine titrated up 37.5 mg TID, and Carvedilol 3.125 mg bid added. CO-OX  73.7 with Milrinone reduction. Diuretics reduced. 24 hour I/O -2.8 liters. Weight up 3 pounds but down 22 pounds overall.   Denies SOB/PND/Orthpnea. Ambulating without dyspnea.   Objective:    Vital Signs:   Temp:  [97.6 F (36.4 C)-99 F (37.2 C)] 97.9 F (36.6 C) (07/01 0734) Pulse Rate:  [84-88] 88  (07/01 0734) Resp:  [16-24] 24  (06/30 2045) BP: (109-148)/(68-94) 140/94 mmHg (07/01 0734) SpO2:  [97 %-98 %] 98 % (07/01 0734) Weight:  [199 lb 1.2 oz (90.3 kg)] 199 lb 1.2 oz (90.3 kg) (07/01 0346)  Last BM Date: 04/16/12   24-hour weight change: Weight change: 2 lb 13.9 oz (1.3 kg)  Weight trends: Filed Weights   04/17/12 0500 04/18/12 0400 04/19/12 0346  Weight: 208 lb 15.9 oz (94.8 kg) 196 lb 3.4 oz (89 kg) 199 lb 1.2 oz (90.3 kg)    Intake/Output:  06/30 0701 - 07/01 0700 In: 637.2 [P.O.:300; I.V.:337.2] Out: 3475 [Urine:3475]     Physical Exam: BP 140/94  Pulse 88  Temp 97.9 F (36.6 C) (Oral)  Resp 24  Ht 5\' 11"  (1.803 m)  Wt 199 lb 4.7 oz (90.4 kg)  BMI 27.80 kg/m2  SpO2 98%  CVP 3  General: Vital signs reviewed and noted. Well-developed, well-nourished, in no acute distress; alert, appropriate and cooperative .  Head: Normocephalic, atraumatic.  Eyes: conjunctivae/corneas clear.  EOM's intact.   Throat: normal  Neck: Supple. Normal carotids. No JVD  Lungs:  Clear to auscultation  Heart: Regular rate,  With normal  S1 S2. No murmurs, gallops or rubs  Abdomen:  Soft, non-tender, non-distended with normoactive bowel sounds. No hepatomegaly. No rebound/guarding. No abdominal masses.  Extremities: Distal pedal pulses are 2+ .  No edema.    Neurologic: A&O X3, CN II - XII  are grossly intact. Motor strength is 5/5 in the all 4 extremities.  Psych: Responds to questions appropriately with normal affect.    Labs: BMET:  Basename 04/19/12 0440 04/18/12 0425  NA 130* 135  K 4.3 4.4  CL 92* 97  CO2 23 27  GLUCOSE 319* 145*  BUN 16 16  CREATININE 0.96 1.10  CALCIUM 9.6 9.6  MG -- --  PHOS -- --    Liver function tests: No results found for this basename: AST:2,ALT:2,ALKPHOS:2,BILITOT:2,PROT:2,ALBUMIN:2 in the last 72 hours No results found for this basename: LIPASE:2,AMYLASE:2 in the last 72 hours  CBC: No results found for this basename: WBC:2,NEUTROABS:2,HGB:2,HCT:2,MCV:2,PLT:2 in the last 72 hours  Cardiac Enzymes: No results found for this basename: CKTOTAL:4,CKMB:4,TROPONINI:4 in the last 72 hours  Coagulation Studies: No results found for this basename: LABPROT:5,INR:5 in the last 72 hours    Tele:    Medications:    Infusions:    . milrinone 0.125 mcg/kg/min (04/19/12 0000)    Scheduled Medications:    . aspirin EC  81 mg Oral Daily  . carvedilol  3.125 mg Oral BID WC  . digoxin  0.125 mg Oral Daily  . enoxaparin  40 mg Subcutaneous Q24H  . furosemide  40 mg Oral Daily  . hydrALAZINE  37.5 mg Oral Q8H  . isosorbide mononitrate  30 mg Oral Daily  .  lisinopril  5 mg Oral BID  . potassium chloride  20 mEq Oral BID  . sodium chloride  10-40 mL Intracatheter Q12H  . sodium chloride  3 mL Intravenous Q12H  . spironolactone  25 mg Oral Daily  . DISCONTD: furosemide  40 mg Oral BID    Assessment:   1) Acute systolic heart failure  2) Presumed NICM, EF 20%  3) Hypertension  4) Chronic renal failure, baseline Cr 1.3-1.5  - improved 5) Alcohol use  6) Obesity s/p 40 pound weight loss  7) h/o tobacco use now quit  8) Stress echo 2011 - normal EF no ischemia  9) PVCs  10) Hypokalemia  Plan/Discussion:  He continues to improve. Diuresed 2.8 liters. CVP down to 3. Will stop Milrinone. Agree with Lasix 40 mg po daily.  SBP  >140s. Will increase hydralazine 50 mg TID. Renal function stable.   Lifevest ordered for discharge.   Discussed medication compliance, follow up appointments, low salt food choices, and daily weights. Will provide scales for discharge.  Anticipate discharge in am.   Length of Stay: 8  Jeffrey Murray,Jeffrey Murray 7:52 AM   Patient seen and examined with Jeffrey Becket, Jeffrey Murray. We discussed all aspects of the encounter. I agree with the assessment and plan as stated above. Much improved. Ambulating halls without difficulty. Volume status is good. Agree with stopping milrinone and titrating hydralazine to 50 TID. Would also increase Imdur to 30 BID. Discussed with LifeVest rep. They will fit tomorrow. Probable d/c tomorrow or Wednesday with very close f/u in HF clinic. Continue to titrate hydralazine as tolerated. Can increase ACE-I and b-blocker as outpatient.   Jeffrey Murray 4:43 PM

## 2012-04-19 NOTE — Discharge Summary (Signed)
Advanced Heart Failure Team  Discharge Summary   Patient ID: Jeffrey Murray MRN: [id number], DOB/AGE: August 31, 1971 41 y.o. Admit date: 04/11/2012 D/C date:     04/20/2012   Primary Discharge Diagnoses:  1) Acute systolic heart failure  2) Presumed NICM, EF 20%  3) Hypertension  4) Chronic renal failure, baseline Cr 1.3-1.5  5) Alcohol use  6) h/o tobacco use now quit  7) PVCs  8) Hypokalemia  9 ) Cardigenic Shock     Hospital Course:   41 year old gentlemen with severe untreated hypertension, ETOH use and newly diagnosed systolic heart failure, EF 20%.   Admitted to [facility] 04/11/12 due to progressive dyspnea on exertion. Admit Pro BNP [id number]. Transitioned to Heart Failure Service after ECHO results noted severely dilated cardiomyopathy with EF 20%.Attempts were made to aggressively diurese, however he continued to complain of orthopnea.  Cardiac Catherization completed which reveled severe HF due to NICM likely the result of long standing HTN. RHC/LHC results noted below. Due to cardiogenic shock, Milrionone was initiated via PICC. Due to renal insufficiency, hydralazine and IMDUR aggressively titrated. Renal function  continued to improve while on Milrinone (Creatinine 1.46>1.41>1.35>1.08>1.10>0.96> 1.03).  He tolerated Milrinone wean without difficulty. CO-OX remained stable 78 off Milrinone.  Low dose initiated carvedilol 3.125 mg twice a day started.  Diuresed 23.7 liters during this admission. Blood pressure continued to improve with up  titration of hydralazine and IMDUR. He was evaluated and followed by cardiac rehab. He continued to improve and he was able to ambulate multiple times around nursing unit without dyspnea.PICC removed 04/20/12. Due to the risk of sudden cardiac death, a life vest was placed upon discharge. He will be followed closely in the heart failure clinic for medication titration and ongoing education. Discharge Pro BNP 330.     Lengthy discussions regarding medication  compliance, weighing and recording daily, low salt food choices, and ongoing follow up to heart failure clinic. He was provided with a 34 day supply of heart medications and scales.   He was evaluated by Dr Gala Romney and deemed medically stable for discharge home.    Procedures 04/15/12 RHC/LHC RA = 21 (steep y-descents)  RV = 47/8/21  PA = 45/20 (33)  PCW = 28  Fick cardiac output/index = 3.1/1.4  PVR = 1.5  FA sat = 93%  PA sat = 49%, 46%  SVR = 2037 dynes  Ao Pressure: 109/91 (100)  LV Pressure: 114/22/31 Normal coronaries. Severe NICM.   Discharge Weight Range: Admit weight : 221 pounds Discharge weight: 198 pounds Discharge Vitals: Blood pressure 131/91, pulse 83, temperature 97.5 F (36.4 C), temperature source Oral, resp. rate 16, height 5\' 11"  (1.803 m), weight 90 kg (198 lb 6.6 oz), SpO2 95.00%. CVP: 2  Physical Exam:   General: Well appearing. No resp difficulty  HEENT: normal  Neck: supple. JVP flat  Carotids 2+ bilat; no bruits. No lymphadenopathy or thryomegaly appreciated.  Cor: PMI nondisplaced. Regular rate & rhythm. Soft TR  Lungs: Course RLL Abdomen: soft, nontender, nondistended. No hepatosplenomegaly. No bruits or masses. Good bowel sounds.  Extremities: no cyanosis, clubbing, rash, trace L ankle edema  Neuro: alert & orientedx3, cranial nerves grossly intact. moves all 4 extremities w/o difficulty. Affect pleasant  Telemetry: NSR with PVC and occ trigeminy    Labs: Lab Results  Component Value Date   WBC 5.0 04/15/2012   HGB 14.9 04/15/2012   HCT 42.9 04/15/2012   MCV 88.5 04/15/2012   PLT 259 04/15/2012  Lab 04/20/12 0400  NA 135  K 4.3  CL 99  CO2 24  BUN 14  CREATININE 1.03  CALCIUM 9.7  PROT --  BILITOT --  ALKPHOS --  ALT --  AST --  GLUCOSE 206*   Lab Results  Component Value Date   CHOL 118 04/12/2012   HDL 29* 04/12/2012   LDLCALC 74 04/12/2012   TRIG 76 04/12/2012   BNP (last 3 results)  Basename 04/20/12 0400 04/11/12 1211  03/19/12 1120  PROBNP 330.6* 5087.0* 2723.0*    Diagnostic Studies/Procedures   No results found.  Discharge Medications   Medication List  As of 04/20/2012 10:36 AM   STOP taking these medications         cloNIDine 0.2 MG tablet         TAKE these medications         carvedilol 3.125 MG tablet   Commonly known as: COREG   Take 1 tablet (3.125 mg total) by mouth 2 (two) times daily with a meal.      digoxin 0.125 MG tablet   Commonly known as: LANOXIN   Take 1 tablet (0.125 mg total) by mouth daily.      furosemide 40 MG tablet   Commonly known as: LASIX   Take 1 tablet (40 mg total) by mouth daily.      hydrALAZINE 50 MG tablet   Commonly known as: APRESOLINE   Take 1 tablet (50 mg total) by mouth every 8 (eight) hours.      isosorbide mononitrate 30 MG 24 hr tablet   Commonly known as: IMDUR   Take 1 tablet (30 mg total) by mouth 2 (two) times daily.      lisinopril 5 MG tablet   Commonly known as: PRINIVIL,ZESTRIL   Take 1 tablet (5 mg total) by mouth 2 (two) times daily.      spironolactone 25 MG tablet   Commonly known as: ALDACTONE   Take 1 tablet (25 mg total) by mouth daily.            Disposition   The patient will be discharged in stable condition to home. Discharge Orders    Future Appointments: Provider: Department: Dept Phone: Center:   04/26/2012 1:00 PM [facility] [phone] None   05/05/2012 11:15 AM Lorretta Harp, MD [facility] [phone] [facility]     Future Orders Please Complete By Expires   Diet - low sodium heart healthy      Increase activity slowly      (HEART FAILURE PATIENTS) Call MD:  Anytime you have any of the following symptoms: 1) 3 pound weight gain in 24 hours or 5 pounds in 1 week 2) shortness of breath, with or without a dry hacking cough 3) swelling in the hands, feet or stomach 4) if you have to sleep on extra pillows at night in order to breathe.      Heart Failure patients record your daily  weight using the same scale at the same time of day      Discharge instructions      Comments:   Do the following things EVERYDAY: Weigh yourself in the morning before breakfast. Write it down and keep it in a log. Take your medicines as prescribed Eat low salt foods--Limit salt (sodium) to 2000 mg per day.  Stay as active as you can everyday Limit all fluids for the day to less than 2 liters   ACE Inhibitor / ARB  already ordered        Follow-up Information    Follow up with Arvilla Meres, MD on 04/26/2012. (at 1:00 Garage Code [id number])    Contact information:   [address] [place] [address] [phone]       Follow up with Lorretta Harp, MD on 05/05/2012. (at 1115 am. you may establish the primary care with the clinic if needed.)    Contact information:   [address] [place] [address] [phone]            Duration of Discharge Encounter: Greater than 35 minutes   Signed, CLEGG,AMY  04/20/2012, 10:39 AM   Patient seen and examined with Tonye Becket, NP. We discussed all aspects of the encounter. I agree with the assessment and plan as stated above. He is stable for discharge. Extensive HF education provided. He will f/u next week in HF clinic. Knows to call before appt if he is having problems.   Truman Hayward

## 2012-04-19 NOTE — Progress Notes (Signed)
Inpatient Diabetes Program Recommendations  AACE/ADA: New Consensus Statement on Inpatient Glycemic Control (2009)  Target Ranges:  Prepandial:   less than 140 mg/dL      Peak postprandial:   less than 180 mg/dL (1-2 hours)      Critically ill patients:  140 - 180 mg/dL   Lab [id number] this morning.  Please monitor CBGs TID achs.   A1C=6.1 patient at risk for diabetes.    Thank you  Piedad Climes [facility] Inpatient Diabetes Coordinator [phone]

## 2012-04-19 NOTE — Progress Notes (Signed)
CARDIAC REHAB PHASE I   PRE:  Rate/Rhythm: 90 SR  BP:  Supine: 131/72  Sitting:   Standing:    SaO2: 97 RA  MODE:  Ambulation: 1400 ft   POST:  Rate/Rhythem: 97 SR occ PVC's  BP:  Supine:   Sitting: 137/91  Standing:    SaO2: 97 RA 0945-1020 Tolerated ambulation well without c/o of cp or SOB. VS stable Pt to recliner after walk with call light in reach. Reviewed CHF education with pt. He can verbalize signs and symptoms of CHF,knows to weigh self daily and when to call MD or 911.  Jeffrey Murray

## 2012-04-20 ENCOUNTER — Telehealth (HOSPITAL_COMMUNITY): Payer: Self-pay | Admitting: *Deleted

## 2012-04-20 LAB — BASIC METABOLIC PANEL
CO2: 24 mEq/L (ref 19–32)
Calcium: 9.7 mg/dL (ref 8.4–10.5)
Glucose, Bld: 206 mg/dL — ABNORMAL HIGH (ref 70–99)
Sodium: 135 mEq/L (ref 135–145)

## 2012-04-20 LAB — CARBOXYHEMOGLOBIN
Carboxyhemoglobin: 0.9 % (ref 0.5–1.5)
O2 Saturation: 78.1 %
Total hemoglobin: 18.2 g/dL — ABNORMAL HIGH (ref 13.5–18.0)

## 2012-04-20 LAB — PRO B NATRIURETIC PEPTIDE: Pro B Natriuretic peptide (BNP): 330.6 pg/mL — ABNORMAL HIGH (ref 0–125)

## 2012-04-20 MED ORDER — HYDRALAZINE HCL 50 MG PO TABS: 50.0000 mg | ORAL_TABLET | Freq: Three times a day (TID) | ORAL | Status: DC

## 2012-04-20 MED ORDER — DIGOXIN 125 MCG PO TABS: 0.1250 mg | ORAL_TABLET | Freq: Every day | ORAL | Status: DC

## 2012-04-20 MED ORDER — LISINOPRIL 5 MG PO TABS: 5.0000 mg | ORAL_TABLET | Freq: Two times a day (BID) | ORAL | Status: DC

## 2012-04-20 MED ORDER — CARVEDILOL 3.125 MG PO TABS: 3.1250 mg | ORAL_TABLET | Freq: Two times a day (BID) | ORAL | Status: DC

## 2012-04-20 MED ORDER — ISOSORBIDE MONONITRATE ER 30 MG PO TB24: 30.0000 mg | ORAL_TABLET | Freq: Two times a day (BID) | ORAL | Status: DC

## 2012-04-20 MED ORDER — SPIRONOLACTONE 25 MG PO TABS: 25.0000 mg | ORAL_TABLET | Freq: Every day | ORAL | Status: DC

## 2012-04-20 NOTE — Telephone Encounter (Signed)
Jeffrey Murray with Pharmacy called and spoke w/Jeffrey Murray

## 2012-04-20 NOTE — Progress Notes (Signed)
Pt was given discharge instruction. Pt verbalized understanding. Pt was given 30 day supply of his medication along with a scale. Pt also given his belongings. Wheeled out to the car and no distress noted.

## 2012-04-20 NOTE — Progress Notes (Signed)
CARDIAC REHAB PHASE I   PRE:  Rate/Rhythm: 90 SR with PVCs    BP: sitting 111/88    SaO2:   MODE:  Ambulation: 2100 ft   POST:  Rate/Rhythm: 97 SR with PVCs    BP: sitting 113/79     SaO2:   Tolerated well. C/o weak legs. Denied SOB. Reviewed low sodium again and daily wts. Pt will need continued reiteration, reception is seemingly inadequate. Used teach back method through HF book. Pt interested in CRPII and will send referral to [place]. Will have coverage if Medicaid approved. [phone]  Harriet Masson CES, ACSM

## 2012-04-20 NOTE — Telephone Encounter (Signed)
Crystal from outpatient phamacy called regarding some questions about a prescription that was ordered for Mr Hy. Please call her back. Thanks.

## 2012-04-26 ENCOUNTER — Encounter (HOSPITAL_COMMUNITY): Payer: Self-pay

## 2012-04-26 ENCOUNTER — Telehealth (HOSPITAL_COMMUNITY): Payer: Self-pay | Admitting: Adult Health

## 2012-04-26 ENCOUNTER — Ambulatory Visit (HOSPITAL_COMMUNITY)
Admission: RE | Admit: 2012-04-26 | Discharge: 2012-04-26 | Disposition: A | Discharge status: Home / Self Care | Payer: Medicaid Other | Source: Ambulatory Visit | Attending: Internal Medicine | Admitting: Internal Medicine

## 2012-04-26 VITALS — BP 110/70 | HR 84 | Wt 205.6 lb

## 2012-04-26 DIAGNOSIS — M79675 Pain in left toe(s): Secondary | ICD-10-CM | POA: Insufficient documentation

## 2012-04-26 DIAGNOSIS — I10 Essential (primary) hypertension: Secondary | ICD-10-CM | POA: Insufficient documentation

## 2012-04-26 DIAGNOSIS — I5022 Chronic systolic (congestive) heart failure: Secondary | ICD-10-CM | POA: Insufficient documentation

## 2012-04-26 DIAGNOSIS — M79609 Pain in unspecified limb: Secondary | ICD-10-CM | POA: Insufficient documentation

## 2012-04-26 DIAGNOSIS — I509 Heart failure, unspecified: Secondary | ICD-10-CM | POA: Insufficient documentation

## 2012-04-26 DIAGNOSIS — F1010 Alcohol abuse, uncomplicated: Secondary | ICD-10-CM | POA: Insufficient documentation

## 2012-04-26 LAB — URIC ACID: Uric Acid, Serum: 12.4 mg/dL — ABNORMAL HIGH (ref 4.0–7.8)

## 2012-04-26 LAB — BASIC METABOLIC PANEL
Calcium: 9.6 mg/dL (ref 8.4–10.5)
Creatinine, Ser: 1.42 mg/dL — ABNORMAL HIGH (ref 0.50–1.35)
GFR calc non Af Amer: 60 mL/min — ABNORMAL LOW (ref >90)
Sodium: 134 mEq/L — ABNORMAL LOW (ref 135–145)

## 2012-04-26 MED ORDER — ALLOPURINOL 100 MG PO TABS: 200.0000 mg | ORAL_TABLET | Freq: Every day | ORAL | Status: DC

## 2012-04-26 MED ORDER — CARVEDILOL 3.125 MG PO TABS: 6.2500 mg | ORAL_TABLET | Freq: Two times a day (BID) | ORAL | Status: DC

## 2012-04-26 NOTE — Assessment & Plan Note (Addendum)
Volume status stable. Mild dyspnea on inclined surfaces. Increase Carvedilol 6.25 mg twice a day. Anticipate up titration carvedilol at next visit. Lengthy discussion regarding medication compliance, limiting fluid intake to less than 2 liters per day, and daily weights. I explained purpose of Hydralazine and frequency. He understands he will need to follow up for medication titration. Plan to repeat ECHO in 3 months if EF remains <35% will need to refer for ICD. Continue lifevest.  Check  BMET today. Follow up in 3 weeks.

## 2012-04-26 NOTE — Telephone Encounter (Signed)
Provided with lab results. Instructed to take Allopurinol 200 mg daily. Follow up 05/12/12 and we will repeat BMET.  Mr Jeffrey Murray verbalized understanding and appreciated call back.

## 2012-04-26 NOTE — Assessment & Plan Note (Signed)
Check uric acid. If elevated will give prednisone and start allopurinol.

## 2012-04-26 NOTE — Assessment & Plan Note (Signed)
Congratulated him on remaining alcohol free.

## 2012-04-26 NOTE — Assessment & Plan Note (Signed)
BP stable. Reinforced medication compliance. Continue current regimen.

## 2012-04-26 NOTE — Patient Instructions (Addendum)
Take carvedilol 6.25 mg twice a day  Follow up in 3 weeks  Do the following things EVERYDAY: 1) Weigh yourself in the morning before breakfast. Write it down and keep it in a log. 2) Take your medicines as prescribed 3) Eat low salt foods-Limit salt (sodium) to 2000 mg per day.  4) Stay as active as you can everyday 5) Limit all fluids for the day to less than 2 liters 

## 2012-04-26 NOTE — Progress Notes (Signed)
Patient ID: Jeffrey Murray, male   DOB: 02-27-1971, 41 y.o.   MRN: [id number] HPI: 41 year old gentlemen with severe untreated hypertension, ETOH use and newly diagnosed systolic heart failure, EF 20%.   Admitted to [facility] 04/11/12 due to progressive dyspnea on exertion. Admit Pro BNP 5087. 04/12/12 ECHO EF 20%. Due to cardiogenic shock, Milrionone was initiated via PICC. Due to renal insufficiency, hydralazine and IMDUR aggressively titrated. Renal function continued to improve while on Milrinone (Creatinine 1.46>1.41>1.35>1.08>1.10>0.96> 1.03). Placed life vest prior to discharge.   Discharge Pro BNP 330. D/C 198 pounds.   04/15/12 RHC/LHC  RA = 21 (steep y-descents)  RV = 47/8/21  PA = 45/20 (33)  PCW = 28  Fick cardiac output/index = 3.1/1.4  PVR = 1.5  FA sat = 93%  PA sat = 49%, 46%  SVR = 2037 dynes  Ao Pressure: 109/91 (100)  LV Pressure: 114/22/31  Normal coronaries. Severe NICM.   He returns for follow up. Feels good but complains of L great toe pain.  Denies SOB/PND/Orthopnea/CP.  Does report mild dyspnea going up hill. Weight at home 200-202 pounds.  He has only had one hydralazine because he was not sure he needed to take it. Compliant with other medications. Not following low salt diet. Eating [place] food and pizza. Drinking > 2 liters of fluid per day. Denies alcohol consumption/drug use. Denies lower extremity edema.  Wears life vest. No shocks.    ROS: All systems negative except as listed in HPI, PMH and Problem List.  Past Medical History  Diagnosis Date  . HTN (hypertension)     not taking medications daily  . CHF (congestive heart failure)     Sytolic, EF 20% by echo 04/11/12    Current Outpatient Prescriptions  Medication Sig Dispense Refill  . carvedilol (COREG) 3.125 MG tablet Take 1 tablet (3.125 mg total) by mouth 2 (two) times daily with a meal.  60 tablet  3  . digoxin (LANOXIN) 0.125 MG tablet Take 1 tablet (0.125 mg total) by mouth daily.  30 tablet  3  .  furosemide (LASIX) 40 MG tablet Take 1 tablet (40 mg total) by mouth daily.  30 tablet  0  . hydrALAZINE (APRESOLINE) 50 MG tablet Take 1 tablet (50 mg total) by mouth every 8 (eight) hours.  90 tablet  3  . isosorbide mononitrate (IMDUR) 30 MG 24 hr tablet Take 1 tablet (30 mg total) by mouth 2 (two) times daily.  60 tablet  3  . lisinopril (PRINIVIL,ZESTRIL) 5 MG tablet Take 1 tablet (5 mg total) by mouth 2 (two) times daily.  60 tablet  3  . spironolactone (ALDACTONE) 25 MG tablet Take 1 tablet (25 mg total) by mouth daily.  30 tablet  6     PHYSICAL EXAM: Filed Vitals:   04/26/12 1327  BP: 110/70  Pulse: 84   Weight change:  205  General:  Well appearing. No resp difficulty HEENT: normal L cheek scar noted.  Neck: supple. JVP flat. Carotids 2+ bilaterally; no bruits. No lymphadenopathy or thryomegaly appreciated. Cor: PMI normal. Regular rate & rhythm. No rubs, gallops or murmurs. Life vest on Lungs: clear Abdomen: soft, nontender, nondistended. No hepatosplenomegaly. No bruits or masses. Good bowel sounds. Extremities: no cyanosis, clubbing, rash, edema Neuro: alert & orientedx3, cranial nerves grossly intact. Moves all 4 extremities w/o difficulty. Affect pleasant.        ASSESSMENT & PLAN:

## 2012-05-05 ENCOUNTER — Encounter: Payer: Self-pay | Admitting: Internal Medicine

## 2012-05-12 ENCOUNTER — Encounter (HOSPITAL_COMMUNITY): Payer: Self-pay

## 2012-05-12 ENCOUNTER — Ambulatory Visit (HOSPITAL_COMMUNITY)
Admission: RE | Admit: 2012-05-12 | Discharge: 2012-05-12 | Disposition: A | Discharge status: Home / Self Care | Payer: Self-pay | Source: Ambulatory Visit | Attending: Internal Medicine | Admitting: Internal Medicine

## 2012-05-12 VITALS — BP 102/70 | HR 75 | Wt 209.1 lb

## 2012-05-12 DIAGNOSIS — I5022 Chronic systolic (congestive) heart failure: Secondary | ICD-10-CM | POA: Insufficient documentation

## 2012-05-12 NOTE — Progress Notes (Signed)
Patient ID: Jeffrey Murray, male   DOB: 1971-01-21, 41 y.o.   MRN: [id number] HPI: 41 year old gentlemen with severe untreated hypertension, ETOH use and newly diagnosed systolic heart failure, EF 20%.   Admitted to [facility] 04/11/12 due to progressive dyspnea on exertion. Admit Pro BNP 5087. 04/12/12 ECHO EF 20%. Due to cardiogenic shock, Milrionone was initiated via PICC. Due to renal insufficiency, hydralazine and IMDUR aggressively titrated. Renal function continued to improve while on Milrinone (Creatinine 1.46>1.41>1.35>1.08>1.10>0.96> 1.03). Placed life vest prior to discharge.   Discharge Pro BNP 330. D/C 198 pounds.   04/15/12 RHC/LHC  RA = 21 (steep y-descents)  RV = 47/8/21  PA = 45/20 (33)  PCW = 28  Fick cardiac output/index = 3.1/1.4  PVR = 1.5  FA sat = 93%  PA sat = 49%, 46%  SVR = 2037 dynes  Ao Pressure: 109/91 (100)  LV Pressure: 114/22/31  Normal coronaries. Severe NICM.   He returns for follow up. Feels good.   Last visit carvedilol 6.25 mg twice daily but he did not increase. Denies SOB/PND/Orthopnea/CP/dizziness. Denies dizziness going hills.  Weight at home 198-205 pounds. Not compliant with other medications. Gets confused with dosages. Following low salt diet.  Continues to drink >2 liters of fluid per day. Denies alcohol consumption/drug use/cigarettes. Denies lower extremity edema.  Wears life vest. No shocks.    ROS: All systems negative except as listed in HPI, PMH and Problem List.  Past Medical History  Diagnosis Date  . HTN (hypertension)     not taking medications daily  . CHF (congestive heart failure)     Sytolic, EF 20% by echo 04/11/12    Current Outpatient Prescriptions  Medication Sig Dispense Refill  . allopurinol (ZYLOPRIM) 100 MG tablet Take 2 tablets (200 mg total) by mouth daily.  60 tablet  3  . carvedilol (COREG) 3.125 MG tablet Take 2 tablets (6.25 mg total) by mouth 2 (two) times daily with a meal.  60 tablet  3  . digoxin (LANOXIN) 0.125  MG tablet Take 1 tablet (0.125 mg total) by mouth daily.  30 tablet  3  . furosemide (LASIX) 40 MG tablet Take 1 tablet (40 mg total) by mouth daily.  30 tablet  0  . hydrALAZINE (APRESOLINE) 50 MG tablet Take 1 tablet (50 mg total) by mouth every 8 (eight) hours.  90 tablet  3  . isosorbide mononitrate (IMDUR) 30 MG 24 hr tablet Take 1 tablet (30 mg total) by mouth 2 (two) times daily.  60 tablet  3  . lisinopril (PRINIVIL,ZESTRIL) 5 MG tablet Take 1 tablet (5 mg total) by mouth 2 (two) times daily.  60 tablet  3  . spironolactone (ALDACTONE) 25 MG tablet Take 1 tablet (25 mg total) by mouth daily.  30 tablet  6     PHYSICAL EXAM: Filed Vitals:   05/12/12 1109  BP: 102/70  Pulse: 75   Weight change:  209 pounds  General:  Well appearing. No resp difficulty HEENT: normal L cheek scar noted.  Neck: supple. JVP flat. Carotids 2+ bilaterally; no bruits. No lymphadenopathy or thryomegaly appreciated. Cor: PMI normal. Regular rate & rhythm. No rubs, gallops or murmurs. Life vest on Lungs: clear Abdomen: soft, nontender, nondistended. No hepatosplenomegaly. No bruits or masses. Good bowel sounds. Extremities: no cyanosis, clubbing, rash, edema Neuro: alert & orientedx3, cranial nerves grossly intact. Moves all 4 extremities w/o difficulty. Affect pleasant.        ASSESSMENT & PLAN:

## 2012-05-12 NOTE — Assessment & Plan Note (Signed)
Functionally he is improving. Able to walk up inclined surface. Volume status stable. He is confused about frequency of medications. Will increase carvedilol 6.25 mg twice daily. I have instructed him to bring all medications to next appointment. Reinforced limiting fluids to less than 2 liters per day and low salt food choices. Follow up in 3 weeks. He is being evaluated for Guide [employer] trial.

## 2012-05-12 NOTE — Patient Instructions (Addendum)
Take Carvedilol 6.25 mg in am and pm. This is 2 tablets in am and pm  Follow up in 3 weeks  Bring all medications with you at the next visit.

## 2012-05-13 ENCOUNTER — Telehealth (HOSPITAL_COMMUNITY): Payer: Self-pay | Admitting: Internal Medicine

## 2012-05-13 NOTE — Telephone Encounter (Signed)
Explained purpose of allopurinol. He was confused about the purpose of allopurinol. Mr Felkins was appreciative of return phone call.

## 2012-05-13 NOTE — Telephone Encounter (Signed)
Please call pt concerning blood pressure medication. Thanks

## 2012-05-25 ENCOUNTER — Telehealth (HOSPITAL_COMMUNITY): Payer: Self-pay | Admitting: Internal Medicine

## 2012-05-25 NOTE — Telephone Encounter (Signed)
Jeffrey Murray called and stated that he is low on these meds Carvedilol, Spironolactone, Digoxin, Isosorbide and Lisinopril. Jeffrey Murray say he needs refills and also would like for you to cal once they are ready. Thanks

## 2012-05-25 NOTE — Telephone Encounter (Signed)
Pt has appt 8/8, needs meds from [place], he can wait until then and we will fill meds in outpatient pharmacy

## 2012-05-27 ENCOUNTER — Encounter (HOSPITAL_COMMUNITY): Payer: Self-pay

## 2012-05-27 ENCOUNTER — Ambulatory Visit (HOSPITAL_COMMUNITY)
Admission: RE | Admit: 2012-05-27 | Discharge: 2012-05-27 | Disposition: A | Discharge status: Home / Self Care | Payer: Self-pay | Source: Ambulatory Visit | Attending: Internal Medicine | Admitting: Internal Medicine

## 2012-05-27 VITALS — BP 112/78 | HR 83 | Ht 69.0 in | Wt 208.4 lb

## 2012-05-27 DIAGNOSIS — I5022 Chronic systolic (congestive) heart failure: Secondary | ICD-10-CM | POA: Insufficient documentation

## 2012-05-27 DIAGNOSIS — F1010 Alcohol abuse, uncomplicated: Secondary | ICD-10-CM | POA: Insufficient documentation

## 2012-05-27 MED ORDER — CARVEDILOL 12.5 MG PO TABS: 12.5000 mg | ORAL_TABLET | Freq: Two times a day (BID) | ORAL | Status: DC

## 2012-05-27 NOTE — Assessment & Plan Note (Addendum)
NYHA II. Able to ambulate without dyspnea. Volume status stable. He has difficulty with medication titration therefore will increase carvedilol from 6.26 mg bid to 12.5 mg bid. He is unable to afford HF medications. Will provide 1 month supply of HF medications. Reinforced alcohol cessation, low salt food choices, medication compliance, and daily weights. He will need close follow up to ensure medication compliance. Follow up in 2 weeks. Will need repeat ECHO the end of September to reassess EF.   >25 minutes spent discussing medications .

## 2012-05-27 NOTE — Assessment & Plan Note (Signed)
Started drinking alcohol again. Reinforced alcohol cessation.

## 2012-05-27 NOTE — Patient Instructions (Addendum)
Take Carvedilol 12.5 mg twice a day  Follow up in 2 weeks   Do the following things EVERYDAY: 1) Weigh yourself in the morning before breakfast. Write it down and keep it in a log. 2) Take your medicines as prescribed 3) Eat low salt foods-Limit salt (sodium) to 2000 mg per day.  4) Stay as active as you can everyday 5) Limit all fluids for the day to less than 2 liters

## 2012-05-27 NOTE — Progress Notes (Signed)
Patient ID: Jeffrey Murray, male   DOB: [birth year], 41 y.o.   MRN: [id number] HPI: 41 year old gentlemen with severe untreated hypertension, ETOH use and newly diagnosed systolic heart failure, EF 20%.   Admitted to [facility] 04/11/12 due to progressive dyspnea on exertion. Admit Pro BNP 5087. 04/12/12 ECHO EF 20%. Due to cardiogenic shock, Milrionone was initiated via PICC. Due to renal insufficiency, hydralazine and IMDUR aggressively titrated. Renal function continued to improve while on Milrinone (Creatinine 1.46>1.41>1.35>1.08>1.10>0.96> 1.03). Placed life vest prior to discharge.   Discharge Pro BNP 330. D/C 198 pounds.   04/15/12 RHC/LHC  RA = 21 (steep y-descents)  RV = 47/8/21  PA = 45/20 (33)  PCW = 28  Fick cardiac output/index = 3.1/1.4  PVR = 1.5  FA sat = 93%  PA sat = 49%, 46%  SVR = 2037 dynes  Ao Pressure: 109/91 (100)  LV Pressure: 114/22/31  Normal coronaries. Severe NICM.   He returns for follow up. Last visit carvedilol 6.25 mg twice a day. He ran out of his medications 2 days ago. At previus visit he had difficulty with medication titration. Denies SOB/PND/Orthopnea. Occasionally dizzy. He has had on 12 ounce beer. Denies tobacco use. Weight at home 202-205 pounds. Appetite good. Continue to wear life vest. No shocks. He can not afford medications.   ROS: All systems negative except as listed in HPI, PMH and Problem List.  Past Medical History  Diagnosis Date  . HTN (hypertension)     not taking medications daily  . CHF (congestive heart failure)     Sytolic, EF 20% by echo 04/11/12    Current Outpatient Prescriptions  Medication Sig Dispense Refill  . carvedilol (COREG) 3.125 MG tablet Take 2 tablets (6.25 mg total) by mouth 2 (two) times daily with a meal.  60 tablet  3  . digoxin (LANOXIN) 0.125 MG tablet Take 1 tablet (0.125 mg total) by mouth daily.  30 tablet  3  . furosemide (LASIX) 40 MG tablet Take 1 tablet (40 mg total) by mouth daily.  30 tablet  0  .  hydrALAZINE (APRESOLINE) 50 MG tablet Take 1 tablet (50 mg total) by mouth every 8 (eight) hours.  90 tablet  3  . isosorbide mononitrate (IMDUR) 30 MG 24 hr tablet Take 1 tablet (30 mg total) by mouth 2 (two) times daily.  60 tablet  3  . lisinopril (PRINIVIL,ZESTRIL) 5 MG tablet Take 1 tablet (5 mg total) by mouth 2 (two) times daily.  60 tablet  3  . spironolactone (ALDACTONE) 25 MG tablet Take 1 tablet (25 mg total) by mouth daily.  30 tablet  6  . allopurinol (ZYLOPRIM) 100 MG tablet Take 2 tablets (200 mg total) by mouth daily.  60 tablet  3     PHYSICAL EXAM: Filed Vitals:   05/27/12 1141  BP: 112/78  Pulse: 83   Weight change:  208 (209 pounds)   General:  Well appearing. No resp difficulty HEENT: normal L cheek scar noted.  Neck: supple. JVP flat. Carotids 2+ bilaterally; no bruits. No lymphadenopathy or thryomegaly appreciated. Cor: PMI normal. Regular rate & rhythm. No rubs, gallops or murmurs. Life vest on Lungs: clear Abdomen: soft, nontender, nondistended. No hepatosplenomegaly. No bruits or masses. Good bowel sounds. Extremities: no cyanosis, clubbing, rash, edema Neuro: alert & orientedx3, cranial nerves grossly intact. Moves all 4 extremities w/o difficulty. Affect pleasant.        ASSESSMENT & PLAN:

## 2012-05-31 ENCOUNTER — Telehealth (HOSPITAL_COMMUNITY): Payer: Self-pay | Admitting: *Deleted

## 2012-05-31 NOTE — Telephone Encounter (Signed)
Message copied by Noralee Space on Mon May 31, 2012  3:58 PM ------      Message from: JEFFRIES, Leron Croak M      Created: Mon May 31, 2012 11:33 AM       Pt requesting to speak with Amy. Pt feels he maybe having a med reaction to Coreg. Pt states he will wake up with a migraine after bedtime dose. Pls advise

## 2012-05-31 NOTE — Telephone Encounter (Signed)
Pt c/o headaches he is on Imdur advised this could be cause he states he would like to try and see if it gets better he will call back if continues

## 2012-06-07 ENCOUNTER — Encounter (HOSPITAL_COMMUNITY): Payer: Self-pay

## 2012-06-07 ENCOUNTER — Ambulatory Visit (HOSPITAL_COMMUNITY)
Admission: RE | Admit: 2012-06-07 | Discharge: 2012-06-07 | Disposition: A | Discharge status: Home / Self Care | Payer: Self-pay | Source: Ambulatory Visit | Attending: Internal Medicine | Admitting: Internal Medicine

## 2012-06-07 VITALS — BP 148/86 | HR 78 | Ht 71.0 in | Wt 213.1 lb

## 2012-06-07 DIAGNOSIS — I5022 Chronic systolic (congestive) heart failure: Secondary | ICD-10-CM | POA: Insufficient documentation

## 2012-06-07 DIAGNOSIS — F1010 Alcohol abuse, uncomplicated: Secondary | ICD-10-CM | POA: Insufficient documentation

## 2012-06-07 DIAGNOSIS — I10 Essential (primary) hypertension: Secondary | ICD-10-CM | POA: Insufficient documentation

## 2012-06-07 MED ORDER — LISINOPRIL 5 MG PO TABS: ORAL_TABLET | ORAL | Status: DC

## 2012-06-07 NOTE — Assessment & Plan Note (Signed)
As above, increase lisinopril.  The patient has not taken medications today, will take after appointment.

## 2012-06-07 NOTE — Assessment & Plan Note (Signed)
Is abstaining from alcohol at this time, encouraged him to continue the good work.

## 2012-06-07 NOTE — Patient Instructions (Addendum)
Can take extra lasix if weight is up 2-3 pounds in 24 hours.  Increase lisinopril 1 tab in the morning and 2 tabs in the evening.    If headaches return stop imdur (isosorbide mononitrate).  Follow up 2-3 weeks.   Do the following things EVERYDAY: 1) Weigh yourself in the morning before breakfast. Write it down and keep it in a log. 2) Take your medicines as prescribed 3) Eat low salt foods-Limit salt (sodium) to 2000 mg per day.  4) Stay as active as you can everyday 5) Limit all fluids for the day to less than 2 liters

## 2012-06-07 NOTE — Progress Notes (Signed)
HPI: 41 year old gentlemen with severe untreated hypertension, ETOH use and newly diagnosed systolic heart failure, EF 20%.   Admitted to [facility] 04/11/12 due to progressive dyspnea on exertion. Admit Pro BNP 5087. 04/12/12 ECHO EF 20%. Due to cardiogenic shock, Milrionone was initiated via PICC. Due to renal insufficiency, hydralazine and IMDUR aggressively titrated. Renal function continued to improve while on Milrinone (Creatinine 1.46>1.41>1.35>1.08>1.10>0.96> 1.03). Placed life vest prior to discharge.   Discharge Pro BNP 330. D/C 198 pounds.   04/15/12 RHC/LHC  RA = 21 (steep y-descents)  RV = 47/8/21  PA = 45/20 (33)  PCW = 28  Fick cardiac output/index = 3.1/1.4  PVR = 1.5  FA sat = 93%  PA sat = 49%, 46%  SVR = 2037 dynes  Ao Pressure: 109/91 (100)  LV Pressure: 114/22/31  Normal coronaries. Severe NICM.   He returns for follow up.  He feels well.  Last visit carvedilol titrated to 12.5 mg twice a day, he says he was feeling sluggish after increase but this is improving.  He had migraine type headaches that were felt to be due to imdur but these have improved slightly.  He says he has been compliant with is medications.  Denies SOB/PND/Orthopnea.  Weight at home has increased to 207-209 pounds.  He denies recent alcohol use.  Continues to wear life vest, no shocks.  ROS: All systems negative except as listed in HPI, PMH and Problem List.  Past Medical History  Diagnosis Date  . HTN (hypertension)     not taking medications daily  . CHF (congestive heart failure)     Sytolic, EF 20% by echo 04/11/12    Current Outpatient Prescriptions  Medication Sig Dispense Refill  . allopurinol (ZYLOPRIM) 100 MG tablet Take 2 tablets (200 mg total) by mouth daily.  60 tablet  3  . carvedilol (COREG) 12.5 MG tablet Take 1 tablet (12.5 mg total) by mouth 2 (two) times daily with a meal.  60 tablet  3  . digoxin (LANOXIN) 0.125 MG tablet Take 1 tablet (0.125 mg total) by mouth daily.  30 tablet  3    . furosemide (LASIX) 40 MG tablet Take 1 tablet (40 mg total) by mouth daily.  30 tablet  0  . hydrALAZINE (APRESOLINE) 50 MG tablet Take 1 tablet (50 mg total) by mouth every 8 (eight) hours.  90 tablet  3  . isosorbide mononitrate (IMDUR) 30 MG 24 hr tablet Take 1 tablet (30 mg total) by mouth 2 (two) times daily.  60 tablet  3  . lisinopril (PRINIVIL,ZESTRIL) 5 MG tablet Take 1 tablet (5 mg total) by mouth 2 (two) times daily.  60 tablet  3  . spironolactone (ALDACTONE) 25 MG tablet Take 1 tablet (25 mg total) by mouth daily.  30 tablet  6     PHYSICAL EXAM: Filed Vitals:   06/07/12 1009  BP: 148/86  Pulse: 78  Height: 5\' 11"  (1.803 m)  Weight: 213 lb 1.9 oz (96.671 kg)  SpO2: 98%     General:  Well appearing. No resp difficulty HEENT: normal L cheek scar noted.  Neck: supple. JVP appears flat. Carotids 2+ bilaterally; no bruits. No lymphadenopathy or thryomegaly appreciated. Cor: PMI normal. Regular rate & rhythm. No rubs, gallops or murmurs. Life vest in plcae Lungs: clear Abdomen: soft, nontender, nondistended. No hepatosplenomegaly. No bruits or masses. Good bowel sounds. Extremities: no cyanosis, clubbing, rash, trace edema Neuro: alert & orientedx3, cranial nerves grossly intact. Moves all 4  extremities w/o difficulty. Affect pleasant.        ASSESSMENT & PLAN:

## 2012-06-07 NOTE — Assessment & Plan Note (Signed)
Volume status trending up, slight weight gain in 2 weeks.  Will have him take extra lasix today and if weight increases 2-3 pounds daily.  Would like weight closer to 203-205 pounds at home.  Will increase afterload reduction with lisinopril to 5/10 mg.  If headache persist have instructed him to hold imdur to see if this makes a difference, he will call if this remains an issue.  BMET today with GUIDE-IT trial.

## 2012-06-28 ENCOUNTER — Encounter (HOSPITAL_COMMUNITY): Payer: Self-pay

## 2012-06-28 ENCOUNTER — Ambulatory Visit (HOSPITAL_COMMUNITY)
Admission: RE | Admit: 2012-06-28 | Discharge: 2012-06-28 | Disposition: A | Discharge status: Home / Self Care | Payer: Self-pay | Source: Ambulatory Visit | Attending: Internal Medicine | Admitting: Internal Medicine

## 2012-06-28 VITALS — BP 122/66 | HR 71 | Ht 71.0 in | Wt 212.4 lb

## 2012-06-28 DIAGNOSIS — I509 Heart failure, unspecified: Secondary | ICD-10-CM | POA: Insufficient documentation

## 2012-06-28 DIAGNOSIS — I10 Essential (primary) hypertension: Secondary | ICD-10-CM

## 2012-06-28 DIAGNOSIS — I5022 Chronic systolic (congestive) heart failure: Secondary | ICD-10-CM

## 2012-06-28 MED ORDER — CARVEDILOL 12.5 MG PO TABS: 18.7500 mg | ORAL_TABLET | Freq: Two times a day (BID) | ORAL | Status: DC

## 2012-06-28 NOTE — Assessment & Plan Note (Signed)
Controlled.  As above, will try to titrate carvedilol.

## 2012-06-28 NOTE — Patient Instructions (Addendum)
Increase Carvedilol to 1 & 1/2 tabs (18.75 mg) twice daily   Your physician recommends that you schedule a follow-up appointment in: 2-3 weeks with an echo

## 2012-06-28 NOTE — Progress Notes (Signed)
HPI: 41 year old gentlemen with severe untreated hypertension, ETOH use and newly diagnosed systolic heart failure, EF 20%.   Admitted to [facility] 04/11/12 due to progressive dyspnea on exertion. Admit Pro BNP 5087. 04/12/12 ECHO EF 20%. Due to cardiogenic shock, Milrionone was initiated via PICC. Due to renal insufficiency, hydralazine and IMDUR aggressively titrated. Renal function continued to improve while on Milrinone (Creatinine 1.46>1.41>1.35>1.08>1.10>0.96> 1.03). Placed life vest prior to discharge.   Discharge Pro BNP 330. D/C 198 pounds.   04/15/12 RHC/LHC  RA = 21 (steep y-descents)  RV = 47/8/21  PA = 45/20 (33)  PCW = 28  Fick cardiac output/index = 3.1/1.4  PVR = 1.5  FA sat = 93%  PA sat = 49%, 46%  SVR = 2037 dynes  Ao Pressure: 109/91 (100)  LV Pressure: 114/22/31  Normal coronaries. Severe NICM.   He returns for follow up.  He feels well.  He is tolerating meds well.  He denies dyspnea, orthopnea, PND.  He ran to the bus yesterday without much dyspnea.  His lifevest did alarm while he was running though.  Weight at home stable 212 pounds.  He denies alcohol use. Dizziness occurs in the morning after taking his am meds.  Denies dizziness in the afternoon or at night.     Guide-IT trial: usual care   ROS: All systems negative except as listed in HPI, PMH and Problem List.  Past Medical History  Diagnosis Date  . HTN (hypertension)     not taking medications daily  . CHF (congestive heart failure)     Sytolic, EF 20% by echo 04/11/12    Current Outpatient Prescriptions  Medication Sig Dispense Refill  . carvedilol (COREG) 12.5 MG tablet Take 1 tablet (12.5 mg total) by mouth 2 (two) times daily with a meal.  60 tablet  3  . digoxin (LANOXIN) 0.125 MG tablet Take 1 tablet (0.125 mg total) by mouth daily.  30 tablet  3  . furosemide (LASIX) 40 MG tablet Take 1 tablet (40 mg total) by mouth daily.  30 tablet  0  . hydrALAZINE (APRESOLINE) 50 MG tablet Take 1 tablet (50 mg  total) by mouth every 8 (eight) hours.  90 tablet  3  . isosorbide mononitrate (IMDUR) 30 MG 24 hr tablet Take 1 tablet (30 mg total) by mouth 2 (two) times daily.  60 tablet  3  . lisinopril (PRINIVIL,ZESTRIL) 5 MG tablet Take 1 tab in AM and 2 tabs in PM  60 tablet  3  . spironolactone (ALDACTONE) 25 MG tablet Take 1 tablet (25 mg total) by mouth daily.  30 tablet  6  . allopurinol (ZYLOPRIM) 100 MG tablet Take 2 tablets (200 mg total) by mouth daily.  60 tablet  3     PHYSICAL EXAM: Filed Vitals:   06/28/12 1132  BP: 122/66  Pulse: 71  Height: 5\' 11"  (1.803 m)  Weight: 212 lb 6.4 oz (96.344 kg)  SpO2: 99%     General:  Well appearing. No resp difficulty HEENT: normal L cheek scar noted.  Neck: supple. JVP appears flat. Carotids 2+ bilaterally; no bruits. No lymphadenopathy or thryomegaly appreciated. Cor: PMI normal. Regular rate & rhythm. No rubs, gallops or murmurs. Life vest in plcae Lungs: clear Abdomen: soft, nontender, nondistended. No hepatosplenomegaly. No bruits or masses. Good bowel sounds. Extremities: no cyanosis, clubbing, rash, edema Neuro: alert & orientedx3, cranial nerves grossly intact. Moves all 4 extremities w/o difficulty. Affect pleasant.  ASSESSMENT & PLAN:

## 2012-06-28 NOTE — Assessment & Plan Note (Signed)
Volume status looks good.  NYHA II-III.  Will titrate night time carvedilol 18.75 and continue 12.5 mg in am due to dizziness.  Will plan for repeat echo in 2-3 weeks to re-evaluate EF.  If EF <35% will refer for ICD implantation for prevention of SCD.  If EF> 35% will return LifeVest and continue medical therapy.

## 2012-06-29 ENCOUNTER — Telehealth (HOSPITAL_COMMUNITY): Payer: Self-pay | Admitting: Cardiology

## 2012-06-29 MED ORDER — PREDNISONE 20 MG PO TABS: 40.0000 mg | ORAL_TABLET | Freq: Every day | ORAL | Status: AC

## 2012-06-29 NOTE — Telephone Encounter (Signed)
Pt states over night left knee has swollen up, it is painful and he can't really bend it, he denies injury does have history of gout, per Ulyess Blossom, PA give prednisone 40 mg daily for 3 days

## 2012-06-29 NOTE — Telephone Encounter (Signed)
Pt called c/o L knee swelling and pain.  Please call

## 2012-07-22 ENCOUNTER — Encounter (HOSPITAL_COMMUNITY): Payer: Self-pay

## 2012-07-22 ENCOUNTER — Ambulatory Visit (HOSPITAL_COMMUNITY)
Admission: RE | Admit: 2012-07-22 | Discharge: 2012-07-22 | Disposition: A | Discharge status: Home / Self Care | Payer: Medicaid Other | Source: Ambulatory Visit | Attending: Internal Medicine | Admitting: Internal Medicine

## 2012-07-22 ENCOUNTER — Ambulatory Visit (HOSPITAL_BASED_OUTPATIENT_CLINIC_OR_DEPARTMENT_OTHER)
Admission: RE | Admit: 2012-07-22 | Discharge: 2012-07-22 | Disposition: A | Discharge status: Home / Self Care | Payer: Medicaid Other | Source: Ambulatory Visit | Attending: Internal Medicine | Admitting: Internal Medicine

## 2012-07-22 VITALS — BP 144/90 | HR 86 | Wt 217.0 lb

## 2012-07-22 DIAGNOSIS — I369 Nonrheumatic tricuspid valve disorder, unspecified: Secondary | ICD-10-CM | POA: Insufficient documentation

## 2012-07-22 DIAGNOSIS — I10 Essential (primary) hypertension: Secondary | ICD-10-CM | POA: Insufficient documentation

## 2012-07-22 DIAGNOSIS — I509 Heart failure, unspecified: Secondary | ICD-10-CM | POA: Insufficient documentation

## 2012-07-22 DIAGNOSIS — I517 Cardiomegaly: Secondary | ICD-10-CM

## 2012-07-22 DIAGNOSIS — I5022 Chronic systolic (congestive) heart failure: Secondary | ICD-10-CM

## 2012-07-22 NOTE — Progress Notes (Signed)
Patient ID: Jeffrey Murray, male   DOB: [birth year], 41 y.o.   MRN: [id number] HPI: 41 year old gentlemen with severe untreated hypertension, ETOH use and newly diagnosed systolic heart failure, EF 20%.   Admitted to [facility] 04/11/12 due to progressive dyspnea on exertion. Admit Pro BNP 5087. 04/12/12 ECHO EF 20%. Due to cardiogenic shock, Milrionone was initiated via PICC. Due to renal insufficiency, hydralazine and IMDUR aggressively titrated. Renal function continued to improve while on Milrinone (Creatinine 1.46>1.41>1.35>1.08>1.10>0.96> 1.03). Placed life vest prior to discharge.   Discharge Pro BNP 330. D/C 198 pounds.   04/15/12 RHC/LHC  RA = 21 (steep y-descents)  RV = 47/8/21  PA = 45/20 (33)  PCW = 28  Fick cardiac output/index = 3.1/1.4  PVR = 1.5  FA sat = 93%  PA sat = 49%, 46%  SVR = 2037 dynes  Ao Pressure: 109/91 (100)  LV Pressure: 114/22/31  Normal coronaries. Severe NICM.   08/01/2012 ECHO EF 40% Grade 1 diastolic dysfunction  He returns for follow up. Complains of fatigue, headache, and dizziness. SOB with exertion. Denies CP/Orthopnea. He continues to wear life vest. Weight at  Home. Says he is 2 1/2 tabs of carvedilol 12.5 tablets twice a day (which is 31 mg BID). He was only taking lisinopril  5 mg twice a day however he was instructed to take lisinopril 5 mg in am and 10 mg in pm. . He is not sure which medications he is taking. 07/23/12 He returns today to complete visit and clarify medications. He did not take any HF medications last night due to medications confusion. Weight at home 216. No lower extremity edema. Denies smoking. He has had one beer over the last 2 weeks.     Guide-IT trial: usual care   ROS: All systems negative except as listed in HPI, PMH and Problem List.  Past Medical History  Diagnosis Date  . HTN (hypertension)     not taking medications daily  . CHF (congestive heart failure)     Sytolic, EF 20% by echo 04/11/12    Current Outpatient  Prescriptions  Medication Sig Dispense Refill  . allopurinol (ZYLOPRIM) 100 MG tablet Take 2 tablets (200 mg total) by mouth daily.  60 tablet  3  . carvedilol (COREG) 12.5 MG tablet Take 1.5 tablets (18.75 mg total) by mouth 2 (two) times daily with a meal.  90 tablet  3  . digoxin (LANOXIN) 0.125 MG tablet Take 1 tablet (0.125 mg total) by mouth daily.  30 tablet  3  . furosemide (LASIX) 40 MG tablet Take 1 tablet (40 mg total) by mouth daily.  30 tablet  0  . hydrALAZINE (APRESOLINE) 50 MG tablet Take 1 tablet (50 mg total) by mouth every 8 (eight) hours.  90 tablet  3  . isosorbide mononitrate (IMDUR) 30 MG 24 hr tablet Take 1 tablet (30 mg total) by mouth 2 (two) times daily.  60 tablet  3  . lisinopril (PRINIVIL,ZESTRIL) 5 MG tablet Take 1 tab in AM and 2 tabs in PM  60 tablet  3  . spironolactone (ALDACTONE) 25 MG tablet Take 1 tablet (25 mg total) by mouth daily.  30 tablet  6     PHYSICAL EXAM: Filed Vitals:   07/22/12 1519 07/23/12 0812  BP: 118/80 144/90  Pulse: 77 86  Weight: 217 lb (98.431 kg)   SpO2: 99% 98%     General:  Well appearing. No resp difficulty girlfriend present HEENT: normal L cheek  scar noted.  Neck: supple. JVP appears flat. Carotids 2+ bilaterally; no bruits. No lymphadenopathy or thryomegaly appreciated. Cor: PMI normal. Regular rate & rhythm. No rubs, gallops or murmurs. Life vest in plcae Lungs: coarse througout Abdomen: soft, nontender, nondistended. No hepatosplenomegaly. No bruits or masses. Good bowel sounds. Extremities: no cyanosis, clubbing, rash, edema Neuro: alert & orientedx3, cranial nerves grossly intact. Moves all 4 extremities w/o difficulty. Affect pleasant.        ASSESSMENT & PLAN:

## 2012-07-22 NOTE — Progress Notes (Signed)
*  PRELIMINARY RESULTS* Echocardiogram 2D Echocardiogram has been performed.  Jeffrey Murray 07/22/2012, 4:10 PM

## 2012-07-23 MED ORDER — CARVEDILOL 12.5 MG PO TABS: 18.7500 mg | ORAL_TABLET | Freq: Two times a day (BID) | ORAL | Status: DC

## 2012-07-23 MED ORDER — LISINOPRIL 5 MG PO TABS: 5.0000 mg | ORAL_TABLET | Freq: Two times a day (BID) | ORAL | Status: DC

## 2012-07-23 NOTE — Assessment & Plan Note (Signed)
SBP elevated off medications overnight due medication confusion. Resume HF medications today after > 1 hour spent with pharmacy verifying all medications.

## 2012-07-23 NOTE — Assessment & Plan Note (Signed)
NYHA II. Volume status stable despite 10 weight gain. ECHO results reviewed and discussed. EF improved from 20% to 40%. As noted > 1 hour spent with Jeffrey Murray and his girlfriend.to verify medications with pharmacy. Provided another pill box to assist with medication compliance. As noted he has been taking 2 extra carvedilol daily. Stop life vest. I have stressed the importance of medication compliance, daily weights, and remaining alcohol free. Follow up in 3 weeks and consider titrating medications. He is instructed to bring all medications to follow up appointments.

## 2012-07-23 NOTE — Patient Instructions (Addendum)
Stop wearing life vest and ship back   Follow up in 3 weeks  Do the following things EVERYDAY: 1) Weigh yourself in the morning before breakfast. Write it down and keep it in a log. 2) Take your medicines as prescribed 3) Eat low salt foods-Limit salt (sodium) to 2000 mg per day.  4) Stay as active as you can everyday 5) Limit all fluids for the day to less than 2 liters

## 2012-08-12 ENCOUNTER — Telehealth (HOSPITAL_COMMUNITY): Payer: Self-pay | Admitting: *Deleted

## 2012-08-12 ENCOUNTER — Encounter (HOSPITAL_COMMUNITY): Payer: Medicaid Other

## 2012-08-12 ENCOUNTER — Telehealth (HOSPITAL_COMMUNITY): Payer: Self-pay | Admitting: Cardiology

## 2012-08-12 NOTE — Telephone Encounter (Signed)
Pt has concerns with decreased appetite, states he feels the same as he did before he was admitted to the hospital for two weeks. Just unable to eat.  Weight is up 8-10lbs. (Goal weight is 211, weight on 10/23= 222 10/24=221) Denies SOB or chest pains   Please advise

## 2012-08-12 NOTE — Telephone Encounter (Signed)
Pt called back to say he realized he had been out of his Lasix for a "few days" he did find an old bottle and he will take 80 mg today, he will to go pharmacy and pick up new supply today and take 80 mg again tomorrow, if wt not coming down he will call back, he will keep appt for Tue 10/29

## 2012-08-12 NOTE — Telephone Encounter (Signed)
Spoke pt he states his wt was 212 a few days ago with clothes on and today he is 221 in just his shorts, he denies SOB, states maybe a little edema in legs, he states he doesn't feel like eating, denies abd being swollen or tight, he states he has been taking his meds, he will come for appt today when he can get a ride

## 2012-08-13 ENCOUNTER — Encounter (HOSPITAL_COMMUNITY): Payer: Medicaid Other

## 2012-08-17 ENCOUNTER — Ambulatory Visit (HOSPITAL_COMMUNITY)
Admission: RE | Admit: 2012-08-17 | Discharge: 2012-08-17 | Disposition: A | Discharge status: Home / Self Care | Payer: Medicaid Other | Source: Ambulatory Visit | Attending: Internal Medicine | Admitting: Internal Medicine

## 2012-08-17 ENCOUNTER — Encounter (HOSPITAL_COMMUNITY): Payer: Self-pay

## 2012-08-17 VITALS — BP 148/96 | HR 90 | Ht 70.0 in | Wt 222.1 lb

## 2012-08-17 DIAGNOSIS — I5022 Chronic systolic (congestive) heart failure: Secondary | ICD-10-CM

## 2012-08-17 DIAGNOSIS — I10 Essential (primary) hypertension: Secondary | ICD-10-CM

## 2012-08-17 MED ORDER — LISINOPRIL 10 MG PO TABS: 10.0000 mg | ORAL_TABLET | Freq: Two times a day (BID) | ORAL | Status: DC

## 2012-08-17 NOTE — Patient Instructions (Addendum)
Take Lisiniopril 10 mg in am and pm  Take Lasix 40 mg twice a day today, Wednesday, and Thursday  Follow up next Tuesday at 2:00 for HF diet class and 3:30 for a clinic visit.   Bring all medications next week.   Do the following things EVERYDAY: 1) Weigh yourself in the morning before breakfast. Write it down and keep it in a log. 2) Take your medicines as prescribed 3) Eat low salt foods-Limit salt (sodium) to 2000 mg per day.  4) Stay as active as you can everyday 5) Limit all fluids for the day to less than 2 liters

## 2012-08-17 NOTE — Assessment & Plan Note (Addendum)
NYHA II. Volume status mildly elevated. Weight trending up. Increase lasix to 40 mg bid for the next 3 days then he will resume lasix 40 mg daily. Would like his weight around 217 pounds. Increase lisinopril to 10 mg bid. Watch renal function.  He is eating high salt foods such as oodles and noodles 3 times per week. Today he will obtain another month HF medications from [facility] pharmacy. Follow up next week to attend HF diet class on 08/24/12 at 2:00. He will also follow up at HF clinic week to reassess volume status, BP,  and verify medications. Check BMET next week.

## 2012-08-17 NOTE — Assessment & Plan Note (Signed)
SBP and DBP remain elevated. As noted below lisinopril increased 10 mg bid.

## 2012-08-17 NOTE — Progress Notes (Signed)
Patient ID: Jeffrey Murray, male   DOB: 11/05/1970, 41 y.o.   MRN: [id number] HPI: 41 year old gentlemen with severe untreated hypertension, ETOH use and chronic  systolic heart failure, EF 20%. Guide-IT trial: usual care   Admitted to [facility] 04/11/12 due to progressive dyspnea on exertion. Admit Pro BNP 5087. 04/12/12 ECHO EF 20%. Due to cardiogenic shock, Milrionone was initiated via PICC. Due to renal insufficiency, hydralazine and IMDUR aggressively titrated. Renal function continued to improve while on Milrinone (Creatinine 1.46>1.41>1.35>1.08>1.10>0.96> 1.03). Placed life vest prior to discharge.   Discharge Pro BNP 330. D/C weight 198 pounds.   04/15/12 RHC/LHC  RA = 21 (steep y-descents)  RV = 47/8/21  PA = 45/20 (33)  PCW = 28  Fick cardiac output/index = 3.1/1.4  PVR = 1.5  FA sat = 93%  PA sat = 49%, 46%  SVR = 2037 dynes  Ao Pressure: 109/91 (100)  LV Pressure: 114/22/31  Normal coronaries. Severe NICM.   08/01/2012 ECHO EF 40% Grade 1 diastolic dysfunction  He returns for follow up. Last visit he was instructed to stop wearing life vest as his EF improved to 40%.  This week he was out of lasix for 3-4 days but then he found 3 lasix pills. He has been taking lasix 40 mg daily for the last 3 days and feels better.  (He has received medications from [facility] indigent fund for 2 months) Mild dyspnea with exertion due to weight gain. + Orthopnea (2 large pillows). Denies dizziness.  Weight at home trending up to 221 pounds over the last couple of weeks.  Excellent appetite. Eats oodles and noodles 3 times a week. Drinks 1 beer a week. Able to walk a mile without stopping. Started lifting weights.   Guide-IT trial: usual care   ROS: All systems negative except as listed in HPI, PMH and Problem List.  Past Medical History  Diagnosis Date  . HTN (hypertension)     not taking medications daily  . CHF (congestive heart failure)     Sytolic, EF 20% by echo 04/11/12    Current Outpatient  Prescriptions  Medication Sig Dispense Refill  . allopurinol (ZYLOPRIM) 100 MG tablet Take 2 tablets (200 mg total) by mouth daily.  60 tablet  3  . carvedilol (COREG) 12.5 MG tablet Take 1.5 tablets (18.75 mg total) by mouth 2 (two) times daily with a meal.  90 tablet  3  . digoxin (LANOXIN) 0.125 MG tablet Take 1 tablet (0.125 mg total) by mouth daily.  30 tablet  3  . furosemide (LASIX) 40 MG tablet Take 1 tablet (40 mg total) by mouth daily.  30 tablet  0  . hydrALAZINE (APRESOLINE) 50 MG tablet Take 1 tablet (50 mg total) by mouth every 8 (eight) hours.  90 tablet  3  . isosorbide mononitrate (IMDUR) 30 MG 24 hr tablet Take 1 tablet (30 mg total) by mouth 2 (two) times daily.  60 tablet  3  . lisinopril (PRINIVIL,ZESTRIL) 5 MG tablet Take 1 tablet (5 mg total) by mouth 2 (two) times daily.  60 tablet  3  . spironolactone (ALDACTONE) 25 MG tablet Take 1 tablet (25 mg total) by mouth daily.  30 tablet  6     PHYSICAL EXAM: Filed Vitals:   08/17/12 1349  BP: 148/96  Pulse: 90  Height: 5\' 10"  (1.778 m)  Weight: 222 lb 2.2 oz (100.762 kg)  SpO2: 100%    222 pounds (217 pounds)  General:  Well  appearing. No resp difficulty  HEENT: normal L cheek scar noted.  Neck: supple. JVP appears 7-8. Carotids 2+ bilaterally; no bruits. No lymphadenopathy or thryomegaly appreciated. Cor: PMI normal. Regular rate & rhythm. No rubs, gallops or murmurs. Lungs: coarse through.out Abdomen: soft, nontender, nondistended. No hepatosplenomegaly. No bruits or masses. Good bowel sounds. Extremities: no cyanosis, clubbing, rash, edema Neuro: alert & orientedx3, cranial nerves grossly intact. Moves all 4 extremities w/o difficulty. Affect pleasant.        ASSESSMENT & PLAN:

## 2012-08-24 ENCOUNTER — Ambulatory Visit (HOSPITAL_COMMUNITY)
Admission: RE | Admit: 2012-08-24 | Discharge: 2012-08-24 | Disposition: A | Discharge status: Home / Self Care | Payer: Medicaid Other | Source: Ambulatory Visit | Attending: Internal Medicine | Admitting: Internal Medicine

## 2012-08-24 ENCOUNTER — Encounter (HOSPITAL_COMMUNITY): Payer: Self-pay

## 2012-08-24 VITALS — BP 146/88 | HR 68 | Wt 217.0 lb

## 2012-08-24 DIAGNOSIS — I5022 Chronic systolic (congestive) heart failure: Secondary | ICD-10-CM

## 2012-08-24 DIAGNOSIS — I509 Heart failure, unspecified: Secondary | ICD-10-CM | POA: Insufficient documentation

## 2012-08-24 DIAGNOSIS — I10 Essential (primary) hypertension: Secondary | ICD-10-CM

## 2012-08-24 DIAGNOSIS — F1010 Alcohol abuse, uncomplicated: Secondary | ICD-10-CM | POA: Insufficient documentation

## 2012-08-24 NOTE — Progress Notes (Signed)
Patient ID: Jeffrey Murray, male   DOB: 11-06-1970, 41 y.o.   MRN: [id number] HPI: 41 year old gentlemen with severe untreated hypertension, ETOH use and chronic  systolic heart failure, EF 20%. Guide-IT trial: usual care   Admitted to [facility] 04/11/12 due to progressive dyspnea on exertion. Admit Pro BNP 5087. 04/12/12 ECHO EF 20%. Due to cardiogenic shock, Milrionone was initiated via PICC. Due to renal insufficiency, hydralazine and IMDUR aggressively titrated. Renal function continued to improve while on Milrinone (Creatinine 1.46>1.41>1.35>1.08>1.10>0.96> 1.03). Placed life vest prior to discharge.   Discharge Pro BNP 330. D/C weight 198 pounds.   04/15/12 RHC/LHC  RA = 21 (steep y-descents)  RV = 47/8/21  PA = 45/20 (33)  PCW = 28  Fick cardiac output/index = 3.1/1.4  PVR = 1.5  FA sat = 93%  PA sat = 49%, 46%  SVR = 2037 dynes  Ao Pressure: 109/91 (100)  LV Pressure: 114/22/31  Normal coronaries. Severe NICM.   08/01/2012 ECHO EF 40% Grade 1 diastolic dysfunction  He returns for follow up. Seen last week. Volume up mildly in setting of running out of lasix. Told to double lasix for a few days but still hasn't filled lasix and no lasix x 1 week. BP also up and lisinopril increased to 10 bid.  (He has received medications from [facility] indigent fund for 2 months) Mild dyspnea with exertion. + Orthopnea (2 large pillows). Denies dizziness.  Weight at home trending down a bout 4 pounds.   Not drinking ETOH any more.    Guide-IT trial: usual care   ROS: All systems negative except as listed in HPI, PMH and Problem List.  Past Medical History  Diagnosis Date  . HTN (hypertension)     not taking medications daily  . CHF (congestive heart failure)     Sytolic, EF 20% by echo 04/11/12    Current Outpatient Prescriptions  Medication Sig Dispense Refill  . allopurinol (ZYLOPRIM) 100 MG tablet Take 2 tablets (200 mg total) by mouth daily.  60 tablet  3  . carvedilol (COREG) 12.5 MG tablet Take  1.5 tablets (18.75 mg total) by mouth 2 (two) times daily with a meal.  90 tablet  3  . digoxin (LANOXIN) 0.125 MG tablet Take 1 tablet (0.125 mg total) by mouth daily.  30 tablet  3  . furosemide (LASIX) 40 MG tablet Take 1 tablet (40 mg total) by mouth daily.  30 tablet  0  . hydrALAZINE (APRESOLINE) 50 MG tablet Take 1 tablet (50 mg total) by mouth every 8 (eight) hours.  90 tablet  3  . isosorbide mononitrate (IMDUR) 30 MG 24 hr tablet Take 1 tablet (30 mg total) by mouth 2 (two) times daily.  60 tablet  3  . lisinopril (PRINIVIL,ZESTRIL) 10 MG tablet Take 1 tablet (10 mg total) by mouth 2 (two) times daily.  60 tablet  3  . spironolactone (ALDACTONE) 25 MG tablet Take 1 tablet (25 mg total) by mouth daily.  30 tablet  6     PHYSICAL EXAM: Filed Vitals:   08/24/12 1516  BP: 150/108  Pulse: 68  Weight: 217 lb (98.431 kg)  SpO2: 100%    222 pounds (217 pounds)  General:  Well appearing. No resp difficulty  HEENT: normal L cheek scar noted.  Neck: supple. JVP hard to see. Not overly elevated. Carotids 2+ bilaterally; no bruits. No lymphadenopathy or thryomegaly appreciated. Cor: PMI normal. Regular rate & rhythm. No rubs, gallops or murmurs. Lungs:  coarse through.out Abdomen: soft, nontender, nondistended. No hepatosplenomegaly. No bruits or masses. Good bowel sounds. Extremities: no cyanosis, clubbing, rash, tr edema Neuro: alert & orientedx3, cranial nerves grossly intact. Moves all 4 extremities w/o difficulty. Affect pleasant.        ASSESSMENT & PLAN:

## 2012-08-24 NOTE — Addendum Note (Signed)
Encounter addended by: Noralee Space, RN on: 08/24/2012  3:42 PM<BR>     Documentation filed: Patient Instructions Section

## 2012-08-24 NOTE — Assessment & Plan Note (Signed)
BP still mildly elevated. Will resume lasix. Can titrate lisinopril at next visit.

## 2012-08-24 NOTE — Assessment & Plan Note (Signed)
Weight down a few pounds but still up almost 20 pounds from d/c. Doesn't appear markedly volume overloaded on exam. NYHA II. EF improving. Trying to watch diet. Will resume lasix at 40 daily. Check labs today. See back 2-3 weeks.

## 2012-08-24 NOTE — Patient Instructions (Addendum)
Please pick up your Lasix (Furosemide) from the pharmacy and take 40 mg daily  Your physician recommends that you schedule a follow-up appointment in: 2-3 weeks

## 2012-09-14 ENCOUNTER — Encounter (HOSPITAL_COMMUNITY): Payer: Self-pay

## 2012-09-14 ENCOUNTER — Ambulatory Visit (HOSPITAL_COMMUNITY)
Admission: RE | Admit: 2012-09-14 | Discharge: 2012-09-14 | Disposition: A | Discharge status: Home / Self Care | Payer: Medicaid Other | Source: Ambulatory Visit | Attending: Internal Medicine | Admitting: Internal Medicine

## 2012-09-14 VITALS — BP 162/110 | HR 83 | Wt 219.8 lb

## 2012-09-14 DIAGNOSIS — I5022 Chronic systolic (congestive) heart failure: Secondary | ICD-10-CM | POA: Insufficient documentation

## 2012-09-14 DIAGNOSIS — I10 Essential (primary) hypertension: Secondary | ICD-10-CM | POA: Insufficient documentation

## 2012-09-14 DIAGNOSIS — F1010 Alcohol abuse, uncomplicated: Secondary | ICD-10-CM | POA: Insufficient documentation

## 2012-09-14 LAB — BASIC METABOLIC PANEL
Calcium: 9.3 mg/dL (ref 8.4–10.5)
Creatinine, Ser: 1.54 mg/dL — ABNORMAL HIGH (ref 0.50–1.35)
GFR calc non Af Amer: 54 mL/min — ABNORMAL LOW (ref >90)
Glucose, Bld: 101 mg/dL — ABNORMAL HIGH (ref 70–99)
Sodium: 142 mEq/L (ref 135–145)

## 2012-09-14 LAB — CBC
MCH: 33.5 pg (ref 26.0–34.0)
MCV: 99 fL (ref 78.0–100.0)
Platelets: 212 10*3/uL (ref 150–400)
RBC: 4 MIL/uL — ABNORMAL LOW (ref 4.22–5.81)
RDW: 14.4 % (ref 11.5–15.5)
WBC: 4.8 10*3/uL (ref 4.0–10.5)

## 2012-09-14 NOTE — Assessment & Plan Note (Signed)
SBP elevated because he did not take medications over the last 24 hours. Instructed to resume medications except IMDUR due to headaches. Reinforced medication compliance.

## 2012-09-14 NOTE — Assessment & Plan Note (Addendum)
Strongly encouraged alcohol cessation however he continues to drink 2-3 beers per week. He declines substance abuse counseling.

## 2012-09-14 NOTE — Patient Instructions (Addendum)
Stop IMDUR  Follow up in 2 weeks  Do the following things EVERYDAY: 1) Weigh yourself in the morning before breakfast. Write it down and keep it in a log. 2) Take your medicines as prescribed 3) Eat low salt foods-Limit salt (sodium) to 2000 mg per day.  4) Stay as active as you can everyday 5) Limit all fluids for the day to less than 2 liters

## 2012-09-14 NOTE — Assessment & Plan Note (Addendum)
NYHA II. Volume status stable. Due to ongoing headaches will stop IMDUR. Instructed to resume all medications except IMDUR. Reinforced daily weights, medication compliance, and limit fluids less than 2 liters. Check BMET today. Follow up in 2 weeks to reassess volume status.

## 2012-09-14 NOTE — Progress Notes (Signed)
Patient ID: Jeffrey Murray, male   DOB: June 11, 1971, 41 y.o.   MRN: [id number] HPI: 41 year old gentlemen with severe untreated hypertension, ETOH use and chronic  systolic heart failure, EF 20%. Guide-IT trial: usual care   Admitted to [facility] 04/11/12 due to progressive dyspnea on exertion. Admit Pro BNP 5087. 04/12/12 ECHO EF 20%. Due to cardiogenic shock, Milrionone was initiated via PICC. Due to renal insufficiency, hydralazine and IMDUR aggressively titrated. Renal function continued to improve while on Milrinone (Creatinine 1.46>1.41>1.35>1.08>1.10>0.96> 1.03). Placed life vest prior to discharge.   Discharge Pro BNP 330. D/C weight 198 pounds.   04/15/12 RHC/LHC  RA = 21 (steep y-descents)  RV = 47/8/21  PA = 45/20 (33)  PCW = 28  Fick cardiac output/index = 3.1/1.4  PVR = 1.5  FA sat = 93%  PA sat = 49%, 46%  SVR = 2037 dynes  Ao Pressure: 109/91 (100)  LV Pressure: 114/22/31  Normal coronaries. Severe NICM.   08/01/2012 ECHO EF 40% Grade 1 diastolic dysfunction  He returns for follow up. Complains of headache. Denies SOB/PND/Orthopnea. Complains of daily headaches. Says he passed out last week. Drinks 2-3 beers per day. Smokes marijuana once a week.  He has all medications. He did not take any medications yesterday or today and his headache has improved.  Weight at home 215-216 pounds. Following low salt diet.     Guide-IT trial: usual care   ROS: All systems negative except as listed in HPI, PMH and Problem List.  Past Medical History  Diagnosis Date  . HTN (hypertension)     not taking medications daily  . CHF (congestive heart failure)     Sytolic, EF 20% by echo 04/11/12    Current Outpatient Prescriptions  Medication Sig Dispense Refill  . allopurinol (ZYLOPRIM) 100 MG tablet Take 2 tablets (200 mg total) by mouth daily.  60 tablet  3  . carvedilol (COREG) 12.5 MG tablet Take 1.5 tablets (18.75 mg total) by mouth 2 (two) times daily with a meal.  90 tablet  3  . digoxin  (LANOXIN) 0.125 MG tablet Take 1 tablet (0.125 mg total) by mouth daily.  30 tablet  3  . furosemide (LASIX) 40 MG tablet Take 1 tablet (40 mg total) by mouth daily.  30 tablet  0  . hydrALAZINE (APRESOLINE) 50 MG tablet Take 1 tablet (50 mg total) by mouth every 8 (eight) hours.  90 tablet  3  . isosorbide mononitrate (IMDUR) 30 MG 24 hr tablet Take 1 tablet (30 mg total) by mouth 2 (two) times daily.  60 tablet  3  . lisinopril (PRINIVIL,ZESTRIL) 10 MG tablet Take 1 tablet (10 mg total) by mouth 2 (two) times daily.  60 tablet  3  . spironolactone (ALDACTONE) 25 MG tablet Take 1 tablet (25 mg total) by mouth daily.  30 tablet  6     PHYSICAL EXAM: Filed Vitals:   09/14/12 1300  BP: 162/110  Pulse: 83  Weight: 219 lb 12.8 oz (99.701 kg)  SpO2: 100%    219 pounds (217 pounds)  General:  Well appearing. No resp difficulty  HEENT: normal L cheek scar noted.  Neck: supple. JVP hard to see but does not appear elevated. Carotids 2+ bilaterally; no bruits. No lymphadenopathy or thryomegaly appreciated. Cor: PMI normal. Regular rate & rhythm. No rubs, gallops or murmurs. Lungs: coarse throughout Abdomen: soft, nontender, nondistended. No hepatosplenomegaly. No bruits or masses. Good bowel sounds. Extremities: no cyanosis, clubbing, rash,  edema  Neuro: alert & orientedx3, cranial nerves grossly intact. Moves all 4 extremities w/o difficulty. Affect pleasant.        ASSESSMENT & PLAN:

## 2012-09-22 ENCOUNTER — Ambulatory Visit (HOSPITAL_COMMUNITY)
Admission: RE | Admit: 2012-09-22 | Discharge: 2012-09-22 | Disposition: A | Discharge status: Home / Self Care | Payer: Medicaid Other | Source: Ambulatory Visit | Attending: Internal Medicine | Admitting: Internal Medicine

## 2012-09-22 ENCOUNTER — Encounter (HOSPITAL_COMMUNITY): Payer: Self-pay

## 2012-09-22 VITALS — BP 112/76 | HR 74 | Wt 213.8 lb

## 2012-09-22 DIAGNOSIS — I5022 Chronic systolic (congestive) heart failure: Secondary | ICD-10-CM | POA: Insufficient documentation

## 2012-09-22 LAB — BASIC METABOLIC PANEL
CO2: 22 mEq/L (ref 19–32)
Calcium: 9.3 mg/dL (ref 8.4–10.5)
Creatinine, Ser: 1.71 mg/dL — ABNORMAL HIGH (ref 0.50–1.35)
GFR calc Af Amer: 55 mL/min — ABNORMAL LOW (ref >90)
Sodium: 137 mEq/L (ref 135–145)

## 2012-09-22 NOTE — Progress Notes (Signed)
Pt in for repeat bmet and VS

## 2012-09-29 ENCOUNTER — Ambulatory Visit (HOSPITAL_COMMUNITY): Payer: Medicaid Other

## 2012-09-30 ENCOUNTER — Ambulatory Visit (HOSPITAL_COMMUNITY)
Admission: RE | Admit: 2012-09-30 | Discharge: 2012-09-30 | Disposition: A | Discharge status: Home / Self Care | Payer: Medicaid Other | Source: Ambulatory Visit | Attending: Internal Medicine | Admitting: Internal Medicine

## 2012-09-30 VITALS — BP 126/84 | HR 67 | Wt 215.2 lb

## 2012-09-30 DIAGNOSIS — I10 Essential (primary) hypertension: Secondary | ICD-10-CM | POA: Insufficient documentation

## 2012-09-30 DIAGNOSIS — I5022 Chronic systolic (congestive) heart failure: Secondary | ICD-10-CM

## 2012-09-30 LAB — BASIC METABOLIC PANEL
BUN: 11 mg/dL (ref 6–23)
Chloride: 103 mEq/L (ref 96–112)
GFR calc Af Amer: 64 mL/min — ABNORMAL LOW (ref >90)
GFR calc non Af Amer: 55 mL/min — ABNORMAL LOW (ref >90)
Glucose, Bld: 100 mg/dL — ABNORMAL HIGH (ref 70–99)
Potassium: 3.5 mEq/L (ref 3.5–5.1)
Sodium: 139 mEq/L (ref 135–145)

## 2012-09-30 MED ORDER — CARVEDILOL 12.5 MG PO TABS: ORAL_TABLET | ORAL | Status: DC

## 2012-09-30 NOTE — Progress Notes (Signed)
Patient ID: Jeffrey Murray, male   DOB: 1971-09-26, 41 y.o.   MRN: [id number] HPI: 41 year old gentlemen with severe untreated hypertension, ETOH use and chronic  systolic heart failure, EF 20%. Guide-IT trial: usual care . Intolerant IMDUR due to headaches.   Admitted to [facility] 04/11/12 due to progressive dyspnea on exertion. Admit Pro BNP 5087. 04/12/12 ECHO EF 20%. Due to cardiogenic shock, Milrionone was initiated via PICC. Due to renal insufficiency, hydralazine and IMDUR aggressively titrated. Renal function continued to improve while on Milrinone (Creatinine 1.46>1.41>1.35>1.08>1.10>0.96> 1.03). Placed life vest prior to discharge.   Discharge Pro BNP 330. D/C weight 198 pounds.   04/15/12 RHC/LHC  RA = 21 (steep y-descents)  RV = 47/8/21  PA = 45/20 (33)  PCW = 28  Fick cardiac output/index = 3.1/1.4  PVR = 1.5  FA sat = 93%  PA sat = 49%, 46%  SVR = 2037 dynes  Ao Pressure: 109/91 (100)  LV Pressure: 114/22/31  Normal coronaries. Severe NICM.   08/01/2012 ECHO EF 40% Grade 1 diastolic dysfunction  He returns for follow up. Last visit IMDUR stopped due to headaches. Denies SOB/PND/Orthopnea/Headaches. Weight at home 213-215 pounds. Compliant with medications. Denies lower extremity edema. Drinking 1-2 beers per weekend. Smokes 1 cigarette per week. Able to walk 1 mile.    Guide-IT trial: usual care   ROS: All systems negative except as listed in HPI, PMH and Problem List.  Past Medical History  Diagnosis Date  . HTN (hypertension)     not taking medications daily  . CHF (congestive heart failure)     Sytolic, EF 20% by echo 04/11/12    Current Outpatient Prescriptions  Medication Sig Dispense Refill  . allopurinol (ZYLOPRIM) 100 MG tablet Take 2 tablets (200 mg total) by mouth daily.  60 tablet  3  . carvedilol (COREG) 12.5 MG tablet Take 1.5 tablets (18.75 mg total) by mouth 2 (two) times daily with a meal.  90 tablet  3  . digoxin (LANOXIN) 0.125 MG tablet Take 1 tablet  (0.125 mg total) by mouth daily.  30 tablet  3  . furosemide (LASIX) 40 MG tablet Take 1 tablet (40 mg total) by mouth daily.  30 tablet  0  . hydrALAZINE (APRESOLINE) 50 MG tablet Take 1 tablet (50 mg total) by mouth every 8 (eight) hours.  90 tablet  3  . lisinopril (PRINIVIL,ZESTRIL) 10 MG tablet Take 1 tablet (10 mg total) by mouth 2 (two) times daily.  60 tablet  3  . spironolactone (ALDACTONE) 25 MG tablet Take 1 tablet (25 mg total) by mouth daily.  30 tablet  6     PHYSICAL EXAM: Filed Vitals:   09/30/12 1344  BP: 126/84  Pulse: 67  Weight: 215 lb 4 oz (97.637 kg)  SpO2: 99%   Weight: 218> 215 pounds General:  Well appearing. No resp difficulty  HEENT: normal L cheek scar noted.  Neck: supple. JVP 5-6.  Carotids 2+ bilaterally; no bruits. No lymphadenopathy or thryomegaly appreciated. Cor: PMI normal. Regular rate & rhythm. No rubs, gallops or murmurs. Lungs: coarse throughout Abdomen: soft, nontender, nondistended. No hepatosplenomegaly. No bruits or masses. Good bowel sounds. Extremities: no cyanosis, clubbing, rash,  edema Neuro: alert & orientedx3, cranial nerves grossly intact. Moves all 4 extremities w/o difficulty. Affect pleasant.        ASSESSMENT & PLAN:

## 2012-09-30 NOTE — Assessment & Plan Note (Signed)
Stable  Continue current regimen  

## 2012-09-30 NOTE — Patient Instructions (Addendum)
Take carvedilol 18.75 in am (1 1/2 tabs) and 25 mg in pm ( 2 tabs)  Follow up in 3 weeks  Do the following things EVERYDAY: 1) Weigh yourself in the morning before breakfast. Write it down and keep it in a log. 2) Take your medicines as prescribed 3) Eat low salt foods-Limit salt (sodium) to 2000 mg per day.  4) Stay as active as you can everyday 5) Limit all fluids for the day to less than 2 liters

## 2012-09-30 NOTE — Assessment & Plan Note (Signed)
NYHA II. Volume status stable. Increase night carvedilol dose to 25 mg and continue day time carvedilol at 18.75 mg. Check BMET today. Follow up in 3 weeks.

## 2012-10-15 ENCOUNTER — Telehealth (HOSPITAL_COMMUNITY): Payer: Self-pay | Admitting: Adult Health

## 2012-10-15 MED ORDER — PREDNISONE (PAK) 10 MG PO TABS: 40.0000 mg | ORAL_TABLET | Freq: Two times a day (BID) | ORAL | Status: DC

## 2012-10-15 NOTE — Telephone Encounter (Signed)
Complaining of bilateral knee pain and R great toe pain.   Instructed to take prednisone 40 mg po bid for 3 days.   Jeffrey Murray verbalized understanding and appreciated the call.   [name] 4:13 PM

## 2012-10-21 ENCOUNTER — Ambulatory Visit (HOSPITAL_COMMUNITY)
Admission: RE | Admit: 2012-10-21 | Discharge: 2012-10-21 | Disposition: A | Discharge status: Home / Self Care | Payer: Medicaid Other | Source: Ambulatory Visit | Attending: Internal Medicine | Admitting: Internal Medicine

## 2012-10-21 ENCOUNTER — Encounter (HOSPITAL_COMMUNITY): Payer: Self-pay

## 2012-10-21 VITALS — BP 160/102 | HR 91 | Wt 222.4 lb

## 2012-10-21 DIAGNOSIS — M109 Gout, unspecified: Secondary | ICD-10-CM | POA: Insufficient documentation

## 2012-10-21 DIAGNOSIS — I10 Essential (primary) hypertension: Secondary | ICD-10-CM | POA: Insufficient documentation

## 2012-10-21 DIAGNOSIS — I5022 Chronic systolic (congestive) heart failure: Secondary | ICD-10-CM

## 2012-10-21 MED ORDER — COLCHICINE 0.6 MG PO TABS: ORAL_TABLET | ORAL | Status: DC

## 2012-10-21 NOTE — Progress Notes (Signed)
PI: 42 year old gentlemen with severe untreated hypertension, ETOH use and chronic  systolic heart failure, EF 20%. Guide-IT trial: usual care . Intolerant IMDUR due to headaches.   Admitted to [facility] 04/11/12 due to progressive dyspnea on exertion. Admit Pro BNP 5087. 04/12/12 ECHO EF 20%. Due to cardiogenic shock, Milrionone was initiated via PICC. Due to renal insufficiency, hydralazine and IMDUR aggressively titrated. Renal function continued to improve while on Milrinone (Creatinine 1.46>1.41>1.35>1.08>1.10>0.96> 1.03). Placed life vest prior to discharge.   Discharge Pro BNP 330. D/C weight 198 pounds.   04/15/12 RHC/LHC  RA = 21 (steep y-descents)  RV = 47/8/21  PA = 45/20 (33)  PCW = 28  Fick cardiac output/index = 3.1/1.4  PVR = 1.5  FA sat = 93%  PA sat = 49%, 46%  SVR = 2037 dynes  Ao Pressure: 109/91 (100)  LV Pressure: 114/22/31  Normal coronaries. Severe NICM.   08/01/2012 ECHO EF 40% Grade 1 diastolic dysfunction  He returns for routine follow up today.  Feels ok besides gout pain in Lt knee and  Rt toe pain.  He has taken 2 days of prednisone with some help.  Dizziness in am after taking meds had syncopal episode about a month ago.  No edema.  220 pounds at home.  Eating a lot more.  Compliant with meds.  BP up today but has not taken pills yet.  Occ cig.  Drinking a couple of beers but no liquor.  Off lasix for 2 days because of gout.  Not ambulating as much due to knee pain but says breathing is good.  No orthopnea/PND   Guide-IT trial: usual care   ROS: All systems negative except as listed in HPI, PMH and Problem List.  Past Medical History  Diagnosis Date  . HTN (hypertension)     not taking medications daily  . CHF (congestive heart failure)     Sytolic, EF 20% by echo 04/11/12    Current Outpatient Prescriptions  Medication Sig Dispense Refill  . allopurinol (ZYLOPRIM) 100 MG tablet Take 2 tablets (200 mg total) by mouth daily.  60 tablet  3  . carvedilol (COREG)  12.5 MG tablet 18.75 mg in am and 25 mg in pm  120 tablet  3  . digoxin (LANOXIN) 0.125 MG tablet Take 1 tablet (0.125 mg total) by mouth daily.  30 tablet  3  . furosemide (LASIX) 40 MG tablet Take 1 tablet (40 mg total) by mouth daily.  30 tablet  0  . hydrALAZINE (APRESOLINE) 50 MG tablet Take 1 tablet (50 mg total) by mouth every 8 (eight) hours.  90 tablet  3  . lisinopril (PRINIVIL,ZESTRIL) 10 MG tablet Take 1 tablet (10 mg total) by mouth 2 (two) times daily.  60 tablet  3  . predniSONE (STERAPRED UNI-PAK) 10 MG tablet Take 4 tablets (40 mg total) by mouth 2 (two) times daily. For Gout  32 tablet  0  . spironolactone (ALDACTONE) 25 MG tablet Take 1 tablet (25 mg total) by mouth daily.  30 tablet  6     PHYSICAL EXAM: Filed Vitals:   10/21/12 1101  BP: 160/102  Pulse: 91  Weight: 222 lb 6.4 oz (100.88 kg)  SpO2: 100%   General:  Well appearing. No resp difficulty  HEENT: normal L cheek scar noted.  Neck: supple. JVP 5-6.  Carotids 2+ bilaterally; no bruits. No lymphadenopathy or thryomegaly appreciated. Cor: PMI normal. Regular rate & rhythm. No rubs, gallops or murmurs.  Lungs: coarse throughout Abdomen: soft, nontender, nondistended. No hepatosplenomegaly. No bruits or masses. Good bowel sounds. Extremities: no cyanosis, clubbing, rash,  edema Neuro: alert & orientedx3, cranial nerves grossly intact. Moves all 4 extremities w/o difficulty. Affect pleasant.        ASSESSMENT & PLAN:

## 2012-10-21 NOTE — Patient Instructions (Addendum)
Pick up colchicine for gout.    Start lasix back.  Follow up 6 weeks.

## 2012-10-23 DIAGNOSIS — M109 Gout, unspecified: Secondary | ICD-10-CM | POA: Insufficient documentation

## 2012-10-23 NOTE — Assessment & Plan Note (Signed)
Add colchicine 0.6 mg daily for 7 days for gout in knee and toe.

## 2012-10-23 NOTE — Assessment & Plan Note (Signed)
Uncontrolled today as he has not taken any meds prior to his appointment.  Over the last couple of visits BP has been well controlled therefore will continue current regimen.

## 2012-10-23 NOTE — Assessment & Plan Note (Signed)
Volume status trending up due to not being on lasix for 2 days with gout.  Will restart lasix today.  Will also give colchicine for gout flare.  He will call if pain does not subside.  Encouraged him to continue daily weights and re-educated on sliding scale lasix.  He will have BMET/proBNP drawn in 3 weeks with Guide-It trial.

## 2012-11-17 ENCOUNTER — Other Ambulatory Visit (HOSPITAL_COMMUNITY): Payer: Self-pay | Admitting: Adult Health

## 2012-11-18 ENCOUNTER — Emergency Department (HOSPITAL_COMMUNITY)
Admission: EM | Admit: 2012-11-18 | Discharge: 2012-11-18 | Disposition: A | Discharge status: Home / Self Care | Payer: Medicaid Other | Attending: Emergency Medicine | Admitting: Emergency Medicine

## 2012-11-18 ENCOUNTER — Encounter (HOSPITAL_COMMUNITY): Payer: Self-pay

## 2012-11-18 ENCOUNTER — Emergency Department (HOSPITAL_COMMUNITY): Payer: Medicaid Other

## 2012-11-18 DIAGNOSIS — M109 Gout, unspecified: Secondary | ICD-10-CM | POA: Insufficient documentation

## 2012-11-18 DIAGNOSIS — I5020 Unspecified systolic (congestive) heart failure: Secondary | ICD-10-CM | POA: Insufficient documentation

## 2012-11-18 DIAGNOSIS — I10 Essential (primary) hypertension: Secondary | ICD-10-CM | POA: Insufficient documentation

## 2012-11-18 DIAGNOSIS — Z79899 Other long term (current) drug therapy: Secondary | ICD-10-CM | POA: Insufficient documentation

## 2012-11-18 DIAGNOSIS — Z87891 Personal history of nicotine dependence: Secondary | ICD-10-CM | POA: Insufficient documentation

## 2012-11-18 MED ORDER — OXYCODONE-ACETAMINOPHEN 5-325 MG PO TABS: 2.0000 | ORAL_TABLET | ORAL | Status: DC | PRN

## 2012-11-18 MED ORDER — OXYCODONE-ACETAMINOPHEN 5-325 MG PO TABS
2.0000 | ORAL_TABLET | Freq: Once | ORAL | Status: AC
  Administered 2012-11-18: 2 via ORAL
  Filled 2012-11-18: qty 2

## 2012-11-18 MED ORDER — COLCHICINE 0.6 MG PO TABS: 0.6000 mg | ORAL_TABLET | Freq: Two times a day (BID) | ORAL | Status: DC

## 2012-11-18 MED ORDER — COLCHICINE 0.6 MG PO TABS
0.6000 mg | ORAL_TABLET | Freq: Once | ORAL | Status: AC
  Administered 2012-11-18: 0.6 mg via ORAL
  Filled 2012-11-18: qty 1

## 2012-11-18 NOTE — ED Provider Notes (Signed)
Medical screening examination/treatment/procedure(s) were conducted as a shared visit with non-physician practitioner(s) and myself.  I personally evaluated the patient during the encounter  Patient with history of gout in the past and his current left knee likely represents this. He has no fever or. He does have some swelling. Patient will be given colchicine for treatment as well as opiates. He is to return tomorrow for recheck. Doubt that patient has a septic joint  Toy Baker, MD 11/18/12 1450

## 2012-11-18 NOTE — ED Provider Notes (Signed)
History     CSN: [id number]  Arrival date & time 11/18/12  1239   First MD Initiated Contact with Patient 11/18/12 1322      No chief complaint on file.   (Consider location/radiation/quality/duration/timing/severity/associated sxs/prior treatment) HPI Comments: Patient is a 42 year old male with a past medical history of gout, HTN and CHF who presents with a 3 day history of left knee pain. Symptoms started gradually and progressively worsened since the onset. The pain is aching and severe and does not radiate. The pain is constant and made worse by weight bearing activity. Patient reports associated swelling localized to the knee. Patient reports taking tylenol and ibuprofen for pain without relief. No alleviating factors. No associated symptoms.    Past Medical History  Diagnosis Date  . HTN (hypertension)     not taking medications daily  . CHF (congestive heart failure)     Sytolic, EF 20% by echo 04/11/12    History reviewed. No pertinent past surgical history.  History reviewed. No pertinent family history.  History  Substance Use Topics  . Smoking status: Former Smoker    Types: Cigarettes    Quit date: 12/19/2011  . Smokeless tobacco: Not on file  . Alcohol Use: Yes      Review of Systems  Musculoskeletal: Positive for joint swelling.  All other systems reviewed and are negative.    Allergies  Review of patient's allergies indicates no known allergies.  Home Medications   Current Outpatient Rx  Name  Route  Sig  Dispense  Refill  . ALLOPURINOL 100 MG PO TABS   Oral   Take 2 tablets (200 mg total) by mouth daily.   60 tablet   3   . CARVEDILOL 12.5 MG PO TABS      18.75 mg in am and 25 mg in pm   120 tablet   3   . COLCHICINE 0.6 MG PO TABS      Two tablets today then 1 tab daily for 1 week.   20 tablet   2   . DIGOXIN 0.125 MG PO TABS   Oral   Take 1 tablet (0.125 mg total) by mouth daily.   30 tablet   3   . FUROSEMIDE 40 MG PO  TABS   Oral   Take 1 tablet (40 mg total) by mouth daily.   30 tablet   0   . HYDRALAZINE HCL 50 MG PO TABS   Oral   Take 1 tablet (50 mg total) by mouth every 8 (eight) hours.   90 tablet   3   . LISINOPRIL 10 MG PO TABS   Oral   Take 1 tablet (10 mg total) by mouth 2 (two) times daily.   60 tablet   3   . PREDNISONE (PAK) 10 MG PO TABS   Oral   Take 4 tablets (40 mg total) by mouth 2 (two) times daily. For Gout   32 tablet   0   . SPIRONOLACTONE 25 MG PO TABS   Oral   Take 1 tablet (25 mg total) by mouth daily.   30 tablet   6     BP 164/103  Pulse 83  Temp 97.5 F (36.4 C) (Oral)  Resp 18  SpO2 98%  Physical Exam  Nursing note and vitals reviewed. Constitutional: He is oriented to person, place, and time. He appears well-developed and well-nourished. No distress.  HENT:  Head: Normocephalic and atraumatic.  Eyes: Conjunctivae normal  are normal.  Cardiovascular: Normal rate and regular rhythm.  Exam reveals no gallop and no friction rub.   No murmur heard. Pulmonary/Chest: Effort normal and breath sounds normal. He has no wheezes. He has no rales. He exhibits no tenderness.  Abdominal: Soft. There is no tenderness.  Musculoskeletal: Normal range of motion.       Left knee ROM limited due to pain and edema. Tender to palpation of generalized left knee.   Neurological: He is alert and oriented to person, place, and time. Coordination normal.       Strength and sensation equal and intact bilaterally. Speech is goal-oriented. Moves limbs without ataxia.   Skin: Skin is warm and dry.  Psychiatric: He has a normal mood and affect. His behavior is normal.    ED Course  Procedures (including critical care time)  Labs Reviewed - No data to display Dg Knee Complete 4 Views Left  11/18/2012  *RADIOLOGY REPORT*  Clinical Data: Pain and swelling, no injury  LEFT KNEE - COMPLETE 4+ VIEW  Comparison: None.  Findings: There is a large suprapatellar joint effusion no  evidence of acute fracture, or malalignment.  There is mild early degenerative change with narrowing of the medial compartments, peaking of the medial tibial spine and early osteophyte formation at the patellar poles.  Normal bony mineralization.  No aggressive lytic or blastic osseous lesion.  No focal periosteal reaction.  IMPRESSION:  1.  Large suprapatellar joint effusion 2.  No acute fracture, malalignment or other osseous abnormality. 3.  Very early mild degenerative changes.   Original Report Authenticated By: Malachy Moan, M.D.      1. Gout       MDM  1:30 PM Patient will have Percocet and left knee xray.   2:49 PM Xray shows joint effusion. Patient will be treated for acute gout (due to history of acute gout) with oral colchicine per Up To Date. Patient will be discharged with colchicine and percocet for pain. Patient will return tomorrow for recheck to ensure no septic joint. Vitals stable for discharge.       Emilia Beck, PA-C 11/18/12 1454

## 2012-11-18 NOTE — ED Notes (Signed)
Pt presents with 3 day h/o L knee pain.  Pt denies any injury, reports L knee is swollen and warm to touch.  Pt unable to bear weight.

## 2012-11-18 NOTE — ED Notes (Signed)
PT ambulated with baseline gait; VSS; A&Ox3; no signs of distress; respirations even and unlabored; skin warm and dry; no questions upon discharge.  

## 2012-11-19 ENCOUNTER — Encounter (HOSPITAL_COMMUNITY): Payer: Self-pay | Admitting: *Deleted

## 2012-11-19 ENCOUNTER — Emergency Department (HOSPITAL_COMMUNITY)
Admission: EM | Admit: 2012-11-19 | Discharge: 2012-11-19 | Disposition: A | Discharge status: Home / Self Care | Payer: Medicaid Other | Attending: Emergency Medicine | Admitting: Emergency Medicine

## 2012-11-19 DIAGNOSIS — I10 Essential (primary) hypertension: Secondary | ICD-10-CM | POA: Insufficient documentation

## 2012-11-19 DIAGNOSIS — M25469 Effusion, unspecified knee: Secondary | ICD-10-CM | POA: Insufficient documentation

## 2012-11-19 DIAGNOSIS — M25462 Effusion, left knee: Secondary | ICD-10-CM

## 2012-11-19 DIAGNOSIS — Z79899 Other long term (current) drug therapy: Secondary | ICD-10-CM | POA: Insufficient documentation

## 2012-11-19 DIAGNOSIS — I509 Heart failure, unspecified: Secondary | ICD-10-CM | POA: Insufficient documentation

## 2012-11-19 DIAGNOSIS — M109 Gout, unspecified: Secondary | ICD-10-CM | POA: Insufficient documentation

## 2012-11-19 LAB — SYNOVIAL CELL COUNT + DIFF, W/ CRYSTALS
Neutrophil, Synovial: 89 % — ABNORMAL HIGH (ref 0–25)
WBC, Synovial: 10889 /mm3 — ABNORMAL HIGH (ref 0–200)

## 2012-11-19 LAB — GRAM STAIN

## 2012-11-19 NOTE — ED Provider Notes (Signed)
Medical screening examination/treatment/procedure(s) were performed by non-physician practitioner and as supervising physician I was immediately available for consultation/collaboration.   Eviana Sibilia L Shatona Andujar, MD 11/19/12 1833 

## 2012-11-19 NOTE — ED Notes (Signed)
Osie Bond, PA at bedside with supplies for aspiration

## 2012-11-19 NOTE — ED Notes (Signed)
Pt was tx in ED yesterday for L knee pain.  Was told by physician to return if swelling did not decrease, thus he is back.

## 2012-11-19 NOTE — ED Provider Notes (Signed)
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 No history of fever, patient has had gout affecting the other knee in the past. 150 mL of synovial fluid was obtained on joint tap. Patient dispo pending analysis of the synovial fluid.  Results for orders placed during the hospital encounter of 11/19/12  GRAM STAIN      Component Value Range   Specimen Description SYNOVIAL FLUID KNEE LEFT     Special Requests     Gram Stain       Value: ABUNDANT WBC PRESENT,BOTH PMN AND MONONUCLEAR     NO ORGANISMS SEEN   Report Status 11/19/2012 FINAL    PROTEIN, BODY FLUID      Component Value Range   Total protein, fluid 4.2     Fluid Type-FTP SYNOVIAL    CELL COUNT + DIFF,  W/ CRYST-SYNVL FLD      Component Value Range   Color, Synovial YELLOW  YELLOW   Appearance-Synovial HAZY (*) CLEAR   Crystals, Fluid NO CRYSTALS SEEN     WBC, Synovial 16109 (*) 0 - 200 /cu mm   Neutrophil, Synovial 89 (*) 0 - 25 %   Lymphocytes-Synovial Fld 1  0 - 20 %   Monocyte-Macrophage-Synovial Fluid 10 (*) 50 - 90 %   Eosinophils-Synovial 0  0 - 1 %   Other Cells-SYN 0     Dg Knee Complete 4 Views Left  11/18/2012  *RADIOLOGY REPORT*  Clinical Data: Pain and swelling, no injury  LEFT KNEE - COMPLETE 4+ VIEW  Comparison: None.  Findings: There is a large suprapatellar joint effusion no evidence of acute fracture, or malalignment.  There is mild early degenerative change with narrowing of the medial compartments, peaking of the medial tibial spine and early osteophyte formation at the patellar poles.  Normal bony mineralization.  No aggressive lytic or blastic osseous lesion.  No focal periosteal reaction.  IMPRESSION:  1.  Large suprapatellar joint effusion 2.  No acute fracture, malalignment or other osseous abnormality. 3.  Very early mild degenerative changes.   Original Report Authenticated By: Malachy Moan, M.D.    Patient  has a mild amount of white blood cells at around 10,000 however there is a 89% neutrophilia. No crystals or organisms seen on Gram stain. Discussed results with attending Dr. Estell Harpin who personally evaluated the patient. He has advised treatment for gout with ortho followup.  Patient was given scripts for colchicine and Percocet. I advised him to continue with these. Followup with Dr. Rennis Chris.    Pt verbalized understanding and agrees with care plan. Outpatient follow-up and return precautions given.     Wynetta Emery, PA-C 11/19/12 1820

## 2012-11-19 NOTE — ED Notes (Signed)
Synovial fluid taken to lab; verified specimens were from left knee and were acceptable being placed in sterile urine cup.

## 2012-11-19 NOTE — ED Notes (Signed)
Pt leaving ED with crutches that were given previously; pt given d/c teaching and instructed to continue taking medication prescribed last time; Pt instructed on follow-up care; Pt verbalizes understanding of instructions. Pt has no further questions upon d/c. Pt does not appear to be in acute distress; Pt given bus pass.

## 2012-11-19 NOTE — ED Notes (Signed)
Dr.Lockwood at bedside  

## 2012-11-19 NOTE — ED Notes (Signed)
Christa See, PA and Estell Harpin, MD at bedside.

## 2012-11-19 NOTE — ED Provider Notes (Signed)
History     CSN: [id number]  Arrival date & time 11/19/12  1329   First MD Initiated Contact with Patient 11/19/12 1332      Chief Complaint  Patient presents with  . Knee Pain    (Consider location/radiation/quality/duration/timing/severity/associated sxs/prior treatment) HPI Jeffrey Murray is a 42 y.o. male who presents with complaint of knee pain. States knee swelling started several days ago. States was seen yesterday, was told it may be gout, told to return if swelling not improving for aspiration. Concern was possible septic joint. Pt states he did not have any injuries. States hx of gout, once int he other knee but never in this one. Pt states pain with movement, walking, bending. No fever, chills, he has been elevating leg, having immobilizer on with no relief. Taking percocet for pain. Did not take his colchicine.  Past Medical History  Diagnosis Date  . HTN (hypertension)     not taking medications daily  . CHF (congestive heart failure)     Sytolic, EF 20% by echo 04/11/12    History reviewed. No pertinent past surgical history.  No family history on file.  History  Substance Use Topics  . Smoking status: Former Smoker    Quit date: 12/19/2011  . Smokeless tobacco: Not on file  . Alcohol Use: Yes     Comment: occasionally      Review of Systems  Constitutional: Negative for fever and chills.  Respiratory: Negative.   Cardiovascular: Negative.   Musculoskeletal: Positive for joint swelling and arthralgias.  Skin: Negative for color change and wound.  Neurological: Negative for weakness and numbness.    Allergies  Review of patient's allergies indicates no known allergies.  Home Medications   Current Outpatient Rx  Name  Route  Sig  Dispense  Refill  . ALLOPURINOL 100 MG PO TABS   Oral   Take 2 tablets (200 mg total) by mouth daily.   60 tablet   3   . CARVEDILOL 12.5 MG PO TABS      18.75 mg in am and 25 mg in pm   120 tablet   3   .  DIGOXIN 0.125 MG PO TABS   Oral   Take 1 tablet (0.125 mg total) by mouth daily.   30 tablet   3   . FUROSEMIDE 40 MG PO TABS   Oral   Take 1 tablet (40 mg total) by mouth daily.   30 tablet   0   . HYDRALAZINE HCL 50 MG PO TABS   Oral   Take 1 tablet (50 mg total) by mouth every 8 (eight) hours.   90 tablet   3   . LISINOPRIL 10 MG PO TABS   Oral   Take 1 tablet (10 mg total) by mouth 2 (two) times daily.   60 tablet   3   . OXYCODONE-ACETAMINOPHEN 5-325 MG PO TABS   Oral   Take 2 tablets by mouth every 4 (four) hours as needed. For pain         . SPIRONOLACTONE 25 MG PO TABS   Oral   Take 1 tablet (25 mg total) by mouth daily.   30 tablet   6     BP 143/91  Pulse 84  Temp 97.6 F (36.4 C) (Oral)  Resp 18  SpO2 98%  Physical Exam  Nursing note and vitals reviewed. Constitutional: He is oriented to person, place, and time. He appears well-developed and well-nourished. No distress.  Cardiovascular:  Normal rate, regular rhythm and normal heart sounds.   Pulmonary/Chest: Effort normal and breath sounds normal. No respiratory distress. He has no wheezes. He has no rales.  Musculoskeletal:       Left knee is swollen. Pain with ROM, limited due to swelling. Joint is warm to the touch. No erythema or bruising.   Neurological: He is alert and oriented to person, place, and time.  Skin: Skin is warm and dry.    ED Course  Procedures (including critical care time)   Labs Reviewed  GLUCOSE, SYNOVIAL FLUID  SYNOVIAL FLUID, CRYSTAL  BODY FLUID CELL COUNT WITH DIFFERENTIAL  GRAM STAIN  BODY FLUID CULTURE  PROTEIN, BODY FLUID   Dg Knee Complete 4 Views Left  11/18/2012  *RADIOLOGY REPORT*  Clinical Data: Pain and swelling, no injury  LEFT KNEE - COMPLETE 4+ VIEW  Comparison: None.  Findings: There is a large suprapatellar joint effusion no evidence of acute fracture, or malalignment.  There is mild early degenerative change with narrowing of the medial  compartments, peaking of the medial tibial spine and early osteophyte formation at the patellar poles.  Normal bony mineralization.  No aggressive lytic or blastic osseous lesion.  No focal periosteal reaction.  IMPRESSION:  1.  Large suprapatellar joint effusion 2.  No acute fracture, malalignment or other osseous abnormality. 3.  Very early mild degenerative changes.   Original Report Authenticated By: Malachy Moan, M.D.    I attempted aspiration with no success. Dr. Jeraldine Loots to aspirate the knee. Fluid cultures sent. Disposition based on results. Signed out with PA [place].     1. Knee effusion, left       MDM          Lottie Mussel, PA 11/20/12 [phone]

## 2012-11-20 NOTE — ED Provider Notes (Signed)
This was a shared encounter.  On my exam the patient wasn't discomfort to 2 left knee effusion, which is appreciable.  The patient is distally neurovascularly intact.  Absent fever, chills, other overt stigmata, there is low suspicion for infection, though the prominence of effusion warranted arthrocentesis.  On file., labs are pending, the patient was substantially better, however, following removal of significant amounts of fluid from his knee.  Procedure note Arthrocentesis left knee. Indication effusion, pain, concern for infection. Consent verbal. He was scribed, prepped in typical sterile manner.  Following provision of analgesics, 4 mL 1% lidocaine, approximately 120 mL of yellow, slightly opaque fluid was obtained from the knee using a lateral approach. No complications, procedure well tolerated. Labs sent for evaluation    Gerhard Munch, MD 11/20/12 1609

## 2012-11-20 NOTE — ED Provider Notes (Signed)
Medical screening examination/treatment/procedure(s) were conducted as a shared visit with non-physician practitioner(s) and myself.  I personally evaluated the patient during the encounter  Toy Baker, MD 11/20/12 1115

## 2012-11-23 LAB — BODY FLUID CULTURE: Culture: NO GROWTH

## 2012-12-02 ENCOUNTER — Encounter (HOSPITAL_COMMUNITY): Payer: Medicaid Other

## 2012-12-06 ENCOUNTER — Ambulatory Visit (HOSPITAL_COMMUNITY): Payer: Medicaid Other | Attending: Internal Medicine

## 2012-12-15 ENCOUNTER — Ambulatory Visit (HOSPITAL_COMMUNITY)
Admission: RE | Admit: 2012-12-15 | Discharge: 2012-12-15 | Disposition: A | Discharge status: Home / Self Care | Payer: Medicaid Other | Source: Ambulatory Visit | Attending: Internal Medicine | Admitting: Internal Medicine

## 2012-12-15 VITALS — BP 122/76 | HR 75 | Wt 209.8 lb

## 2012-12-15 DIAGNOSIS — I10 Essential (primary) hypertension: Secondary | ICD-10-CM | POA: Insufficient documentation

## 2012-12-15 DIAGNOSIS — I5022 Chronic systolic (congestive) heart failure: Secondary | ICD-10-CM | POA: Insufficient documentation

## 2012-12-15 LAB — BASIC METABOLIC PANEL
BUN: 9 mg/dL (ref 6–23)
CO2: 27 mEq/L (ref 19–32)
Chloride: 104 mEq/L (ref 96–112)
Creatinine, Ser: 1.44 mg/dL — ABNORMAL HIGH (ref 0.50–1.35)

## 2012-12-15 MED ORDER — CARVEDILOL 12.5 MG PO TABS: 25.0000 mg | ORAL_TABLET | Freq: Two times a day (BID) | ORAL | Status: DC

## 2012-12-15 NOTE — Assessment & Plan Note (Signed)
NYHA II.  Volume status looks great.  I have encouraged him to keep up the good work despite being out of lasix.  Will titrate carvedilol 25 mg BID.  Since it is difficult for him to get a ride we will have him pick up lasix today for prn use only.  Will check BMET today since he has been taking spironolactone without lasix.

## 2012-12-15 NOTE — Progress Notes (Signed)
PI: 42 year old gentlemen with severe untreated hypertension, ETOH use and chronic  systolic heart failure, EF 20%. Guide-IT trial: usual care . Intolerant IMDUR due to headaches.   Admitted to [facility] 04/11/12 due to progressive dyspnea on exertion. Admit Pro BNP 5087. 04/12/12 ECHO EF 20%. Due to cardiogenic shock, Milrionone was initiated via PICC. Due to renal insufficiency, hydralazine and IMDUR aggressively titrated. Renal function continued to improve while on Milrinone (Creatinine 1.46>1.41>1.35>1.08>1.10>0.96> 1.03). Placed life vest prior to discharge.   Discharge Pro BNP 330. D/C weight 198 pounds.   04/15/12 RHC/LHC  RA = 21 (steep y-descents)  RV = 47/8/21  PA = 45/20 (33)  PCW = 28  Fick cardiac output/index = 3.1/1.4  PVR = 1.5  FA sat = 93%  PA sat = 49%, 46%  SVR = 2037 dynes  Ao Pressure: 109/91 (100)  LV Pressure: 114/22/31  Normal coronaries. Severe NICM.   08/01/2012 ECHO EF 40% Grade 1 diastolic dysfunction   He returns for follow up today.  He is feeling well.  His weight is back down despite being out of lasix for a month.  He has been unable to get a ride to the pharmacy.  He has been taking his spironolactone and watching fluid/salt intake closely.  He denies edema.  No orhtopnea/PND.  He has had his lt knee drained twice since last visit.  Occ cig and beer but no liquor consumption.   Guide-IT trial: usual care   ROS: All systems negative except as listed in HPI, PMH and Problem List.  Past Medical History  Diagnosis Date  . HTN (hypertension)     not taking medications daily  . CHF (congestive heart failure)     Sytolic, EF 20% by echo 04/11/12    Current Outpatient Prescriptions  Medication Sig Dispense Refill  . allopurinol (ZYLOPRIM) 100 MG tablet Take 2 tablets (200 mg total) by mouth daily.  60 tablet  3  . carvedilol (COREG) 12.5 MG tablet 18.75 mg in am and 25 mg in pm  120 tablet  3  . digoxin (LANOXIN) 0.125 MG tablet Take 1 tablet (0.125 mg total)  by mouth daily.  30 tablet  3  . hydrALAZINE (APRESOLINE) 50 MG tablet Take 1 tablet (50 mg total) by mouth every 8 (eight) hours.  90 tablet  3  . lisinopril (PRINIVIL,ZESTRIL) 10 MG tablet Take 1 tablet (10 mg total) by mouth 2 (two) times daily.  60 tablet  3  . oxyCODONE-acetaminophen (PERCOCET/ROXICET) 5-325 MG per tablet Take 2 tablets by mouth every 4 (four) hours as needed. For pain      . spironolactone (ALDACTONE) 25 MG tablet Take 1 tablet (25 mg total) by mouth daily.  30 tablet  6  . furosemide (LASIX) 40 MG tablet Take 1 tablet (40 mg total) by mouth daily.  30 tablet  0   No current facility-administered medications for this encounter.     PHYSICAL EXAM: Filed Vitals:   12/15/12 1418  BP: 122/76  Pulse: 75  Weight: 209 lb 12 oz (95.142 kg)  SpO2: 99%   General:  Well appearing. No resp difficulty  HEENT: normal L cheek scar noted.  Neck: supple. JVP flat.  Carotids 2+ bilaterally; no bruits. No lymphadenopathy or thryomegaly appreciated. Cor: PMI normal. Regular rate & rhythm. No rubs, gallops or murmurs. Lungs: coarse throughout Abdomen: soft, nontender, nondistended. No hepatosplenomegaly. No bruits or masses. Good bowel sounds. Extremities: no cyanosis, clubbing, rash,  edema Neuro: alert &  orientedx3, cranial nerves grossly intact. Moves all 4 extremities w/o difficulty. Affect pleasant.    ASSESSMENT & PLAN:

## 2012-12-15 NOTE — Assessment & Plan Note (Signed)
Well-controlled, continue current regimen 

## 2012-12-15 NOTE — Patient Instructions (Addendum)
Increase carvedilol 2 tabs twice daily.  Pick up lasix for use as needed for swelling and shortness of breath  Labs today.

## 2013-01-19 ENCOUNTER — Ambulatory Visit (HOSPITAL_COMMUNITY): Payer: Medicaid Other | Attending: Internal Medicine

## 2013-01-25 ENCOUNTER — Ambulatory Visit (HOSPITAL_COMMUNITY)
Admission: RE | Admit: 2013-01-25 | Discharge: 2013-01-25 | Disposition: A | Discharge status: Home / Self Care | Payer: Medicaid Other | Source: Ambulatory Visit | Attending: Internal Medicine | Admitting: Internal Medicine

## 2013-01-25 ENCOUNTER — Encounter (HOSPITAL_COMMUNITY): Payer: Self-pay

## 2013-01-25 VITALS — BP 160/100 | HR 80 | Wt 212.4 lb

## 2013-01-25 DIAGNOSIS — I5022 Chronic systolic (congestive) heart failure: Secondary | ICD-10-CM

## 2013-01-25 DIAGNOSIS — I10 Essential (primary) hypertension: Secondary | ICD-10-CM

## 2013-01-25 MED ORDER — GUAIFENESIN ER 600 MG PO TB12: 600.0000 mg | ORAL_TABLET | Freq: Two times a day (BID) | ORAL | Status: DC

## 2013-01-25 NOTE — Assessment & Plan Note (Addendum)
Volume status mildly elevated today.  Will increase lasix 80 mg daily for 2 days then return to 40 mg daily.  Have re-educated on low sodium diet and fluid restrictions.  He voices understanding.  Will continue digoxin, hydralazine, lisinopril, carvedilol and spiro.

## 2013-01-25 NOTE — Assessment & Plan Note (Signed)
Usually well controlled but elevated today due to not taking meds prior to clinic.  Have discussed importance of med compliance and he states he does not typically miss meds.

## 2013-01-25 NOTE — Patient Instructions (Addendum)
Increase lasix 2 tabs today and tomorrow then return to 1 tab daily.  Follow up 4-6 weeks.

## 2013-01-25 NOTE — Progress Notes (Signed)
PI: 42 year old gentlemen with severe untreated hypertension, ETOH use and chronic  systolic heart failure, EF 20%. Guide-IT trial: usual care . Intolerant IMDUR due to headaches.   Admitted to [facility] 04/11/12 due to progressive dyspnea on exertion. Admit Pro BNP 5087. 04/12/12 ECHO EF 20%. Due to cardiogenic shock, Milrionone was initiated via PICC. Due to renal insufficiency, hydralazine and IMDUR aggressively titrated. Renal function continued to improve while on Milrinone (Creatinine 1.46>1.41>1.35>1.08>1.10>0.96> 1.03). Placed life vest prior to discharge.   Discharge Pro BNP 330. D/C weight 198 pounds.   04/15/12 RHC/LHC  RA = 21 (steep y-descents)  RV = 47/8/21  PA = 45/20 (33)  PCW = 28  Fick cardiac output/index = 3.1/1.4  PVR = 1.5  FA sat = 93%  PA sat = 49%, 46%  SVR = 2037 dynes  Ao Pressure: 109/91 (100)  LV Pressure: 114/22/31  Normal coronaries. Severe NICM.   08/01/2012 ECHO EF 40% Grade 1 diastolic dysfunction   He returns for follow up today.  He feels pretty good.  He has had a couple of shortness of breath episodes over the last couple of days and an episode of PND last night.  He is taking lasix once daily.  He denies edema.  He is compliant with meds and has not missed any doses but is almost out of spiro, lisinopril and digoxin.   Guide-IT trial: usual care   ROS: All systems negative except as listed in HPI, PMH and Problem List.  Past Medical History  Diagnosis Date  . HTN (hypertension)     not taking medications daily  . CHF (congestive heart failure)     Sytolic, EF 20% by echo 04/11/12    Current Outpatient Prescriptions  Medication Sig Dispense Refill  . allopurinol (ZYLOPRIM) 100 MG tablet Take 2 tablets (200 mg total) by mouth daily.  60 tablet  3  . carvedilol (COREG) 12.5 MG tablet Take 2 tablets (25 mg total) by mouth 2 (two) times daily with a meal.  120 tablet  3  . digoxin (LANOXIN) 0.125 MG tablet Take 1 tablet (0.125 mg total) by mouth daily.   30 tablet  3  . furosemide (LASIX) 40 MG tablet Take 1 tablet (40 mg total) by mouth daily.  30 tablet  0  . hydrALAZINE (APRESOLINE) 50 MG tablet Take 1 tablet (50 mg total) by mouth every 8 (eight) hours.  90 tablet  3  . lisinopril (PRINIVIL,ZESTRIL) 10 MG tablet Take 1 tablet (10 mg total) by mouth 2 (two) times daily.  60 tablet  3  . oxyCODONE-acetaminophen (PERCOCET/ROXICET) 5-325 MG per tablet Take 2 tablets by mouth every 4 (four) hours as needed. For pain      . spironolactone (ALDACTONE) 25 MG tablet Take 1 tablet (25 mg total) by mouth daily.  30 tablet  6   No current facility-administered medications for this encounter.     PHYSICAL EXAM: Filed Vitals:   01/25/13 1355  BP: 160/100  Pulse: 80  Weight: 212 lb 6.4 oz (96.344 kg)  SpO2: 98%   General:  Well appearing. No resp difficulty  HEENT: normal L cheek scar noted.  Neck: supple. JVP ~8.  Carotids 2+ bilaterally; no bruits. No lymphadenopathy or thryomegaly appreciated. Cor: PMI normal. Regular rate & rhythm. No rubs, gallops or murmurs. Lungs: coarse bilateral bases Abdomen: soft, nontender, nondistended. No hepatosplenomegaly. No bruits or masses. Good bowel sounds. Extremities: no cyanosis, clubbing, rash, edema.  Multiple tattoos bilateral upper extremities.  Neuro: alert & orientedx3, cranial nerves grossly intact. Moves all 4 extremities w/o difficulty. Affect pleasant.    ASSESSMENT & PLAN:

## 2013-02-23 ENCOUNTER — Ambulatory Visit (HOSPITAL_COMMUNITY): Payer: Medicaid Other | Attending: Internal Medicine

## 2013-03-02 ENCOUNTER — Encounter (HOSPITAL_COMMUNITY): Payer: Medicaid Other

## 2013-03-02 ENCOUNTER — Encounter (HOSPITAL_COMMUNITY): Payer: Self-pay | Admitting: *Deleted

## 2013-03-10 ENCOUNTER — Other Ambulatory Visit (HOSPITAL_COMMUNITY): Payer: Self-pay | Admitting: *Deleted

## 2013-03-10 MED ORDER — DIGOXIN 125 MCG PO TABS: 0.1250 mg | ORAL_TABLET | Freq: Every day | ORAL | Status: DC

## 2013-03-17 ENCOUNTER — Encounter (HOSPITAL_COMMUNITY): Payer: Self-pay

## 2013-03-17 ENCOUNTER — Ambulatory Visit (HOSPITAL_COMMUNITY)
Admission: RE | Admit: 2013-03-17 | Discharge: 2013-03-17 | Disposition: A | Discharge status: Home / Self Care | Payer: Medicaid Other | Source: Ambulatory Visit | Attending: Internal Medicine | Admitting: Internal Medicine

## 2013-03-17 VITALS — BP 142/92 | HR 69 | Wt 211.0 lb

## 2013-03-17 DIAGNOSIS — I10 Essential (primary) hypertension: Secondary | ICD-10-CM | POA: Insufficient documentation

## 2013-03-17 DIAGNOSIS — I5022 Chronic systolic (congestive) heart failure: Secondary | ICD-10-CM | POA: Insufficient documentation

## 2013-03-17 DIAGNOSIS — R0609 Other forms of dyspnea: Secondary | ICD-10-CM | POA: Insufficient documentation

## 2013-03-17 DIAGNOSIS — R0989 Other specified symptoms and signs involving the circulatory and respiratory systems: Secondary | ICD-10-CM | POA: Insufficient documentation

## 2013-03-17 MED ORDER — DIGOXIN 125 MCG PO TABS: 0.1250 mg | ORAL_TABLET | Freq: Every day | ORAL | Status: DC

## 2013-03-17 NOTE — Patient Instructions (Addendum)
Follow up in 3 months   Do the following things EVERYDAY: 1) Weigh yourself in the morning before breakfast. Write it down and keep it in a log. 2) Take your medicines as prescribed 3) Eat low salt foods-Limit salt (sodium) to 2000 mg per day.  4) Stay as active as you can everyday 5) Limit all fluids for the day to less than 2 liters  

## 2013-03-17 NOTE — Progress Notes (Signed)
Patient ID: Jeffrey Murray, male   DOB: 1971-06-30, 42 y.o.   MRN: [id number] PI: 42 year old gentlemen with severe untreated hypertension, ETOH use and chronic  systolic heart failure, EF 20%. Guide-IT trial: usual care . Intolerant IMDUR due to headaches.   Admitted to [facility] 04/11/12 due to progressive dyspnea on exertion. Admit Pro BNP 5087. 04/12/12 ECHO EF 20%. Due to cardiogenic shock, Milrionone was initiated via PICC. Due to renal insufficiency, hydralazine and IMDUR aggressively titrated. Renal function continued to improve while on Milrinone (Creatinine 1.46>1.41>1.35>1.08>1.10>0.96> 1.03). Placed life vest prior to discharge.   Discharge Pro BNP 330. D/C weight 198 pounds.   04/15/12 RHC/LHC  RA = 21 (steep y-descents)  RV = 47/8/21  PA = 45/20 (33)  PCW = 28  Fick cardiac output/index = 3.1/1.4  PVR = 1.5  FA sat = 93%  PA sat = 49%, 46%  SVR = 2037 dynes  Ao Pressure: 109/91 (100)  LV Pressure: 114/22/31  Normal coronaries. Severe NICM.   08/01/2012 ECHO EF 40% Grade 1 diastolic dysfunction  He returns for follow up today.  Denies SOB/PND/Orthopnea. Rarely short of breath. Weight at home 205 pounds. He has all medications except digoxin. Says he is taking all his medications. He skips lasix a couple times a week. Denies lower extremity edema.    Guide-IT trial: usual care   ROS: All systems negative except as listed in HPI, PMH and Problem List.  Past Medical History  Diagnosis Date  . HTN (hypertension)     not taking medications daily  . CHF (congestive heart failure)     Sytolic, EF 20% by echo 04/11/12    Current Outpatient Prescriptions  Medication Sig Dispense Refill  . allopurinol (ZYLOPRIM) 100 MG tablet Take 2 tablets (200 mg total) by mouth daily.  60 tablet  3  . carvedilol (COREG) 12.5 MG tablet Take 2 tablets (25 mg total) by mouth 2 (two) times daily with a meal.  120 tablet  3  . digoxin (LANOXIN) 0.125 MG tablet Take 1 tablet (0.125 mg total) by mouth  daily.  30 tablet  6  . furosemide (LASIX) 40 MG tablet Take 1 tablet (40 mg total) by mouth daily.  30 tablet  0  . guaiFENesin (MUCINEX) 600 MG 12 hr tablet Take 1 tablet (600 mg total) by mouth 2 (two) times daily.  60 tablet  2  . hydrALAZINE (APRESOLINE) 50 MG tablet Take 1 tablet (50 mg total) by mouth every 8 (eight) hours.  90 tablet  3  . lisinopril (PRINIVIL,ZESTRIL) 10 MG tablet Take 1 tablet (10 mg total) by mouth 2 (two) times daily.  60 tablet  3  . oxyCODONE-acetaminophen (PERCOCET/ROXICET) 5-325 MG per tablet Take 2 tablets by mouth every 4 (four) hours as needed. For pain      . spironolactone (ALDACTONE) 25 MG tablet Take 1 tablet (25 mg total) by mouth daily.  30 tablet  6   No current facility-administered medications for this encounter.     PHYSICAL EXAM: Filed Vitals:   03/17/13 1054  BP: 142/92  Pulse: 69  Weight: 211 lb (95.709 kg)  SpO2: 99%   General:  Well appearing. No resp difficulty  HEENT: normal L cheek scar noted.  Neck: supple. JVP 5-6.  Carotids 2+ bilaterally; no bruits. No lymphadenopathy or thryomegaly appreciated. Cor: PMI normal. Regular rate & rhythm. No rubs, gallops or murmurs. Lungs: coarse bilateral bases Abdomen: soft, nontender, nondistended. No hepatosplenomegaly. No bruits or masses. Good  bowel sounds. Extremities: no cyanosis, clubbing, rash, edema.  Multiple tattoos bilateral upper extremities.  Neuro: alert & orientedx3, cranial nerves grossly intact. Moves all 4 extremities w/o difficulty. Affect pleasant.    ASSESSMENT & PLAN:

## 2013-03-17 NOTE — Assessment & Plan Note (Signed)
NYHA II. Volume status stable. Continue current regimen. Reinforced medication compliance, limiting fluid intake to < 2 liters per day, and low salt food choices. Follow up in 3 months.

## 2013-04-02 ENCOUNTER — Telehealth: Payer: Self-pay | Admitting: Nurse Practitioner

## 2013-04-02 NOTE — Telephone Encounter (Signed)
Pt called stating that today he feels slightly sob.  His weight has been steady at 211.  He doesn't believe that he has any swelling.  He has been taking all of his meds as Rx.  He has no way of checking his BP.  He just took an extra lasix.  I advised that if his Ss do not improve, that he will need to come into the ED for evaluation.  Otherwise, if Ss improve and weight is stable in the AM, he should contact the CHF clinic on Monday to arrange for a f/u visit.  He verbalized understanding.

## 2013-04-08 ENCOUNTER — Telehealth: Payer: Self-pay | Admitting: Physician Assistant

## 2013-04-08 NOTE — Telephone Encounter (Signed)
Pt's wife called after-hours. The patient is in [facility] and thinks he has an infected tooth. He has been seen by the ER and they do not want to prescribe him antibiotics. She was wondering if we can call them in instead. He does not have h/o valvular issues. I advised that since we do not manage tooth issues, I do not feel comfortable calling in antibiotics particularly if another provider has deemed that he should not get them. I advised they contact his PCP or his dentist if he needs a second opinion. She verbalized understanding and gratitude. Dayna Dunn PA-C

## 2013-04-26 ENCOUNTER — Other Ambulatory Visit (HOSPITAL_COMMUNITY): Payer: Self-pay | Admitting: *Deleted

## 2013-04-26 MED ORDER — SPIRONOLACTONE 25 MG PO TABS: 25.0000 mg | ORAL_TABLET | Freq: Every day | ORAL | Status: DC

## 2013-04-26 MED ORDER — LISINOPRIL 10 MG PO TABS: 10.0000 mg | ORAL_TABLET | Freq: Two times a day (BID) | ORAL | Status: DC

## 2013-05-23 ENCOUNTER — Telehealth (HOSPITAL_COMMUNITY): Payer: Self-pay | Admitting: *Deleted

## 2013-05-23 NOTE — Telephone Encounter (Signed)
Pt called to report increased SOB, he does not weigh himself daily, he reports no edema but states abd is tight and large, he states he has been taking his medications and has not missed any doses lately, he will take Lasix 80 mg today and tomorrow, he will call back tomorrow for appt if not improving

## 2013-06-10 ENCOUNTER — Encounter (HOSPITAL_COMMUNITY): Payer: Medicaid Other

## 2013-06-16 ENCOUNTER — Encounter (HOSPITAL_COMMUNITY): Payer: Medicaid Other

## 2013-06-16 ENCOUNTER — Ambulatory Visit (HOSPITAL_COMMUNITY): Payer: Medicaid Other | Attending: Internal Medicine

## 2013-06-29 ENCOUNTER — Ambulatory Visit (HOSPITAL_COMMUNITY)
Admission: RE | Admit: 2013-06-29 | Discharge: 2013-06-29 | Disposition: A | Discharge status: Home / Self Care | Payer: Self-pay | Source: Ambulatory Visit | Attending: Internal Medicine | Admitting: Internal Medicine

## 2013-06-29 VITALS — BP 170/110 | HR 94 | Wt 225.0 lb

## 2013-06-29 DIAGNOSIS — I10 Essential (primary) hypertension: Secondary | ICD-10-CM | POA: Insufficient documentation

## 2013-06-29 DIAGNOSIS — Z91199 Patient's noncompliance with other medical treatment and regimen due to unspecified reason: Secondary | ICD-10-CM | POA: Insufficient documentation

## 2013-06-29 DIAGNOSIS — I5022 Chronic systolic (congestive) heart failure: Secondary | ICD-10-CM | POA: Insufficient documentation

## 2013-06-29 DIAGNOSIS — Z9119 Patient's noncompliance with other medical treatment and regimen: Secondary | ICD-10-CM

## 2013-06-29 MED ORDER — FUROSEMIDE 40 MG PO TABS: 40.0000 mg | ORAL_TABLET | Freq: Two times a day (BID) | ORAL | Status: DC

## 2013-06-29 MED ORDER — CARVEDILOL 25 MG PO TABS: 25.0000 mg | ORAL_TABLET | Freq: Two times a day (BID) | ORAL | Status: DC

## 2013-06-29 MED ORDER — HYDRALAZINE HCL 50 MG PO TABS: 50.0000 mg | ORAL_TABLET | Freq: Three times a day (TID) | ORAL | Status: DC

## 2013-06-29 NOTE — Progress Notes (Signed)
Patient ID: Jeffrey Murray, male   DOB: 08-06-1971, 42 y.o.   MRN: [id number] PI: 42 year old gentlemen with severe untreated hypertension, ETOH use and chronic  systolic heart failure, EF 20%. Guide-IT trial: usual care . Intolerant IMDUR due to headaches.   Admitted to [facility] 04/11/12 due to progressive dyspnea on exertion. Admit Pro BNP 5087. 04/12/12 ECHO EF 20%. Due to cardiogenic shock, Milrionone was initiated via PICC. Due to renal insufficiency, hydralazine and IMDUR aggressively titrated. Renal function continued to improve while on Milrinone (Creatinine 1.46>1.41>1.35>1.08>1.10>0.96> 1.03). Placed life vest prior to discharge.   Discharge Pro BNP 330. D/C weight 198 pounds.   04/15/12 RHC/LHC  RA = 21 (steep y-descents)  RV = 47/8/21  PA = 45/20 (33)  PCW = 28  Fick cardiac output/index = 3.1/1.4  PVR = 1.5  FA sat = 93%  PA sat = 49%, 46%  SVR = 2037 dynes  Ao Pressure: 109/91 (100)  LV Pressure: 114/22/31  Normal coronaries. Severe NICM.   08/01/2012 ECHO EF 40% Grade 1 diastolic dysfunction  He returns for follow up today. He has missed his last couple appointments. Dyspnea with exertion.  Denies PND/Orthopnea. Sleeping on 1 pillow. SOB going up steps.  Weight at home 219-220 pounds. He has been out of his medications for about a week. Medicaid has stopped. Says he cant afford his medications. Denies lower extremity edema. Drinking > 2 liters per day. Drinking a beer once a week.    Guide-IT trial: usual care   ROS: All systems negative except as listed in HPI, PMH and Problem List.  Past Medical History  Diagnosis Date  . HTN (hypertension)     not taking medications daily  . CHF (congestive heart failure)     Sytolic, EF 20% by echo 04/11/12    Current Outpatient Prescriptions  Medication Sig Dispense Refill  . allopurinol (ZYLOPRIM) 100 MG tablet Take 2 tablets (200 mg total) by mouth daily.  60 tablet  3  . carvedilol (COREG) 12.5 MG tablet Take 2 tablets (25 mg  total) by mouth 2 (two) times daily with a meal.  120 tablet  3  . digoxin (LANOXIN) 0.125 MG tablet Take 1 tablet (0.125 mg total) by mouth daily.  30 tablet  6  . furosemide (LASIX) 40 MG tablet Take 1 tablet (40 mg total) by mouth daily.  30 tablet  0  . guaiFENesin (MUCINEX) 600 MG 12 hr tablet Take 1 tablet (600 mg total) by mouth 2 (two) times daily.  60 tablet  2  . hydrALAZINE (APRESOLINE) 50 MG tablet Take 1 tablet (50 mg total) by mouth every 8 (eight) hours.  90 tablet  3  . lisinopril (PRINIVIL,ZESTRIL) 10 MG tablet Take 1 tablet (10 mg total) by mouth 2 (two) times daily.  60 tablet  3  . spironolactone (ALDACTONE) 25 MG tablet Take 1 tablet (25 mg total) by mouth daily.  30 tablet  6   No current facility-administered medications for this encounter.     PHYSICAL EXAM: Filed Vitals:   06/29/13 1318  BP: 170/110  Pulse: 94  Weight: 225 lb (102.059 kg)  SpO2: 99%   General:  Well appearing. No resp difficulty  HEENT: normal L cheek scar noted.  Neck: supple. JVP 9-10.  Carotids 2+ bilaterally; no bruits. No lymphadenopathy or thryomegaly appreciated. Cor: PMI normal. Regular rate & rhythm. No rubs, gallops or murmurs. Lungs: coarse bilateral bases Abdomen: obese, soft, nontender, nondistended. No hepatosplenomegaly. No bruits or  masses. Good bowel sounds. Extremities: no cyanosis, clubbing, rash, edema.  Multiple tattoos bilateral upper extremities.  Neuro: alert & orientedx3, cranial nerves grossly intact. Moves all 4 extremities w/o difficulty. Affect pleasant.    ASSESSMENT & PLAN:  1. Chronic Systolic Heart Failure ECHO 07/2012 EF 40%. NYHA IIIb -Volume status mildly elevated. Increase lasix to 40 mg twice a day. Continue spironolactone 25 mg daily -Continue carvedilol 25 mg twice a day. Continue digoxin 0.125 mg daily -Continue hydralazine 50 mg tid and lisinopril 10 mg twice a day -Reinforced daily weights,low salt food choices, and limiting fluid intake to , 2  liters per day -30 day supply of HF meds to be provided today.  -I spoke directly with his wife in [facility] regarding his current condition and the absolute need to take medications as directed.    Check ECHO at next appointment. Check proBNP and BMET today  2. HTN As expected elevated. He has been out of his medications. As noted above HF meds to be supplied at outpatient pharmacy   3. Noncompliance Reinforced medication compliance.    Follow up 2 weeks with and ECHO and to recheck volume status /BP. Will need BMET and dig level.   CLEGG,AMY 1:44 PM

## 2013-06-29 NOTE — Patient Instructions (Addendum)
Follow up in 2 weeks  Take lasix 40 mg twice a day  Do the following things EVERYDAY: 1) Weigh yourself in the morning before breakfast. Write it down and keep it in a log. 2) Take your medicines as prescribed 3) Eat low salt foods-Limit salt (sodium) to 2000 mg per day.  4) Stay as active as you can everyday 5) Limit all fluids for the day to less than 2 liters

## 2013-07-12 ENCOUNTER — Ambulatory Visit (HOSPITAL_COMMUNITY)
Admission: RE | Admit: 2013-07-12 | Discharge: 2013-07-12 | Disposition: A | Discharge status: Home / Self Care | Payer: Medicaid Other | Source: Ambulatory Visit | Attending: Internal Medicine | Admitting: Internal Medicine

## 2013-07-12 ENCOUNTER — Ambulatory Visit (HOSPITAL_BASED_OUTPATIENT_CLINIC_OR_DEPARTMENT_OTHER)
Admission: RE | Admit: 2013-07-12 | Discharge: 2013-07-12 | Disposition: A | Discharge status: Home / Self Care | Payer: Medicaid Other | Source: Ambulatory Visit | Attending: Internal Medicine | Admitting: Internal Medicine

## 2013-07-12 ENCOUNTER — Encounter: Payer: Self-pay | Admitting: Internal Medicine

## 2013-07-12 VITALS — BP 152/82 | HR 70 | Wt 231.0 lb

## 2013-07-12 DIAGNOSIS — I5022 Chronic systolic (congestive) heart failure: Secondary | ICD-10-CM

## 2013-07-12 DIAGNOSIS — I10 Essential (primary) hypertension: Secondary | ICD-10-CM | POA: Insufficient documentation

## 2013-07-12 DIAGNOSIS — I079 Rheumatic tricuspid valve disease, unspecified: Secondary | ICD-10-CM | POA: Insufficient documentation

## 2013-07-12 DIAGNOSIS — I059 Rheumatic mitral valve disease, unspecified: Secondary | ICD-10-CM | POA: Insufficient documentation

## 2013-07-12 DIAGNOSIS — I319 Disease of pericardium, unspecified: Secondary | ICD-10-CM

## 2013-07-12 DIAGNOSIS — I509 Heart failure, unspecified: Secondary | ICD-10-CM | POA: Insufficient documentation

## 2013-07-12 MED ORDER — HYDRALAZINE HCL 50 MG PO TABS: 75.0000 mg | ORAL_TABLET | Freq: Three times a day (TID) | ORAL | Status: DC

## 2013-07-12 NOTE — Progress Notes (Signed)
Echocardiogram 2D Echocardiogram has been performed.  Jeffrey Murray 07/12/2013, 10:45 AM

## 2013-07-12 NOTE — Progress Notes (Signed)
Patient ID: Jeffrey Murray, male   DOB: 01-12-1971, 42 y.o.   MRN: [id number] PI: 42 year old gentlemen with severe untreated hypertension, ETOH use and chronic  systolic heart failure, EF 20%. Guide-IT trial: usual care . Intolerant IMDUR due to headaches.   Admitted to [facility] 04/11/12 due to progressive dyspnea on exertion. Admit Pro BNP 5087. 04/12/12 ECHO EF 20%. Due to cardiogenic shock, Milrionone was initiated via PICC. Due to renal insufficiency, hydralazine and IMDUR aggressively titrated. Renal function continued to improve while on Milrinone (Creatinine 1.46>1.41>1.35>1.08>1.10>0.96> 1.03). Placed life vest prior to discharge.   Discharge Pro BNP 330. D/C weight 198 pounds.   04/15/12 RHC/LHC  RA = 21 (steep y-descents)  RV = 47/8/21  PA = 45/20 (33)  PCW = 28  Fick cardiac output/index = 3.1/1.4  PVR = 1.5  FA sat = 93%  PA sat = 49%, 46%  SVR = 2037 dynes  Ao Pressure: 109/91 (100)  LV Pressure: 114/22/31  Normal coronaries. Severe NICM.   08/01/2012 ECHO EF 40% Grade 1 diastolic dysfunction 07/12/13 ECHO EF 35-40%  He returns for follow up today. Last visit lasix increased to 40 mg twice a day. He was also given a 30 day supply of his HF meds. SOB with mild exertion - feels very limited. Sleeping on 1 pillow. Weight at home 217-220 pounds. Denies lower extremity edema. Drinking > 2 liters per day. Drinking a 24 ounces every other day. Had rib sandwich and barbeque wings for dinner. Snacks all day. Has all his medications.    Guide- IT trial: usual care   ROS: All systems negative except as listed in HPI, PMH and Problem List.  Past Medical History  Diagnosis Date  . HTN (hypertension)     not taking medications daily  . CHF (congestive heart failure)     Sytolic, EF 20% by echo 04/11/12    Current Outpatient Prescriptions  Medication Sig Dispense Refill  . allopurinol (ZYLOPRIM) 100 MG tablet Take 2 tablets (200 mg total) by mouth daily.  60 tablet  3  . carvedilol  (COREG) 25 MG tablet Take 1 tablet (25 mg total) by mouth 2 (two) times daily with a meal.  60 tablet  3  . digoxin (LANOXIN) 0.125 MG tablet Take 1 tablet (0.125 mg total) by mouth daily.  30 tablet  6  . furosemide (LASIX) 40 MG tablet Take 1 tablet (40 mg total) by mouth 2 (two) times daily.  60 tablet  6  . guaiFENesin (MUCINEX) 600 MG 12 hr tablet Take 1 tablet (600 mg total) by mouth 2 (two) times daily.  60 tablet  2  . hydrALAZINE (APRESOLINE) 50 MG tablet Take 1 tablet (50 mg total) by mouth every 8 (eight) hours.  90 tablet  10  . lisinopril (PRINIVIL,ZESTRIL) 10 MG tablet Take 1 tablet (10 mg total) by mouth 2 (two) times daily.  60 tablet  3  . spironolactone (ALDACTONE) 25 MG tablet Take 1 tablet (25 mg total) by mouth daily.  30 tablet  6   No current facility-administered medications for this encounter.     PHYSICAL EXAM: Filed Vitals:   07/12/13 1056  BP: 152/82  Pulse: 70  Weight: 231 lb (104.781 kg)  SpO2: 99%   General:  Well appearing. No resp difficulty  HEENT: normal L cheek scar noted.  Neck: supple. JVP difficult to see.  Carotids 2+ bilaterally; no bruits. No lymphadenopathy or thryomegaly appreciated. Cor: PMI normal. Regular rate & rhythm. No  rubs, gallops or murmurs. Lungs: coarse bilateral bases Abdomen: obese, soft, nontender, nondistended. No hepatosplenomegaly. No bruits or masses. Good bowel sounds. Extremities: no cyanosis, clubbing, rash, edema.  Multiple tattoos bilateral upper extremities.  Neuro: alert & orientedx3, cranial nerves grossly intact. Moves all 4 extremities w/o difficulty. Affect pleasant.    ASSESSMENT & PLAN:  1. Chronic Systolic Heart Failure ECHO 07/2012 EF 40%. NYHA IIIb -Volume status stable despite weight gain. Continue lasix to 40 mg twice a day. Continue spironolactone 25 mg daily. Instructed to take an additional lasix for weight gain.  -Continue carvedilol 25 mg twice a day. Continue digoxin 0.125 mg daily -Increase  hydralazine 75 mg tid he is intolerant Imdur due to headaches  -Continue lisinopril 10 mg twice a day -Reinforced daily weights,low salt food choices, and limiting fluid intake to , 2 liters per day BMET and Pro BNP today. Dr Gala Romney reviewed and discussed ECHO EF  35-40% Offered CPX to further evaluate dyspnea.   2. HTN As expected elevated. He has been out of his medications. As noted above HF meds to be supplied at outpatient pharmacy   3. Noncompliance Reinforced medication compliance.   4. Alcohol Abuse Drinking 24 ounces of beer every other day. Reinforced alcohol cessation.   Follow up in 1 month to discuss CPX results.   CLEGG,AMY NP-C  11:00 AM   Patient seen and examined with Tonye Becket, NP. We discussed all aspects of the encounter. I agree with the assessment and plan as stated above. Echo reviewed personally EF 35-40%. Remains NYHA Class III. Volume status hard to assess but doesn't seem more than mildly elevated. Agree with increasing hydralazine. Given significant functional limitation have discussed possibility of RHC vs CPX to help sort out. Will proceed with CPX testing. Have counseled on need for dietary discretion and weight loss.   Reuel Boom Bensimhon,MD 12:53 PM

## 2013-07-12 NOTE — Addendum Note (Signed)
Encounter addended by: Noralee Space, RN on: 07/12/2013  1:24 PM<BR>     Documentation filed: Orders

## 2013-07-12 NOTE — Patient Instructions (Addendum)
Follow up in 1 month    Take hydralazine 75 mg three times a day  Do the following things EVERYDAY: 1) Weigh yourself in the morning before breakfast. Write it down and keep it in a log. 2) Take your medicines as prescribed 3) Eat low salt foods-Limit salt (sodium) to 2000 mg per day.  4) Stay as active as you can everyday 5) Limit all fluids for the day to less than 2 liters

## 2013-07-20 ENCOUNTER — Telehealth (HOSPITAL_COMMUNITY): Payer: Self-pay | Admitting: *Deleted

## 2013-07-20 DIAGNOSIS — I5022 Chronic systolic (congestive) heart failure: Secondary | ICD-10-CM

## 2013-07-20 NOTE — Telephone Encounter (Signed)
Per Tonye Becket, NP pt needs a bmet tomorrow, pt will go to [facility], order placed

## 2013-07-21 ENCOUNTER — Encounter: Payer: Self-pay | Admitting: Internal Medicine

## 2013-07-21 ENCOUNTER — Other Ambulatory Visit: Payer: Medicaid Other

## 2013-07-25 ENCOUNTER — Encounter (HOSPITAL_COMMUNITY): Payer: Medicaid Other

## 2013-08-04 ENCOUNTER — Encounter: Payer: Self-pay | Admitting: Internal Medicine

## 2013-08-04 ENCOUNTER — Ambulatory Visit (HOSPITAL_COMMUNITY): Payer: Medicaid Other | Attending: Adult Health

## 2013-08-04 DIAGNOSIS — I5022 Chronic systolic (congestive) heart failure: Secondary | ICD-10-CM

## 2013-08-04 DIAGNOSIS — I509 Heart failure, unspecified: Secondary | ICD-10-CM | POA: Insufficient documentation

## 2013-08-11 ENCOUNTER — Encounter: Payer: Self-pay | Admitting: Internal Medicine

## 2013-08-11 ENCOUNTER — Encounter (HOSPITAL_COMMUNITY): Payer: Self-pay

## 2013-08-11 ENCOUNTER — Ambulatory Visit (HOSPITAL_COMMUNITY)
Admission: RE | Admit: 2013-08-11 | Discharge: 2013-08-11 | Disposition: A | Discharge status: Home / Self Care | Payer: Medicaid Other | Source: Ambulatory Visit | Attending: Internal Medicine | Admitting: Internal Medicine

## 2013-08-11 VITALS — BP 170/108 | HR 97 | Wt 235.8 lb

## 2013-08-11 DIAGNOSIS — Z09 Encounter for follow-up examination after completed treatment for conditions other than malignant neoplasm: Secondary | ICD-10-CM | POA: Insufficient documentation

## 2013-08-11 DIAGNOSIS — I509 Heart failure, unspecified: Secondary | ICD-10-CM | POA: Insufficient documentation

## 2013-08-11 DIAGNOSIS — I5022 Chronic systolic (congestive) heart failure: Secondary | ICD-10-CM | POA: Insufficient documentation

## 2013-08-11 MED ORDER — ALLOPURINOL 100 MG PO TABS: 200.0000 mg | ORAL_TABLET | Freq: Every day | ORAL | Status: DC

## 2013-08-11 MED ORDER — LISINOPRIL 40 MG PO TABS: 40.0000 mg | ORAL_TABLET | Freq: Two times a day (BID) | ORAL | Status: DC

## 2013-08-11 NOTE — Progress Notes (Signed)
Patient ID: Jeffrey Murray, male   DOB: 10/07/1971, 42 y.o.   MRN: [id number]  HPI: 42 year old gentlemen with severe untreated hypertension, ETOH use and chronic systolic heart failure due to NICM with EF 20%.   Enrolled in Guide-IT trial: usual care . Intolerant IMDUR due to headaches.   Admitted to [facility] 04/11/12 due to progressive dyspnea on exertion. Admit Pro BNP 5087. 04/12/12 ECHO EF 20%. Due to cardiogenic shock, Milrionone was initiated via PICC. Due to renal insufficiency, hydralazine and IMDUR aggressively titrated. Renal function continued to improve while on Milrinone (Creatinine 1.46>1.41>1.35>1.08>1.10>0.96> 1.03). Placed life vest prior to discharge.   Discharge Pro BNP 330. D/C weight 198 pounds.   04/15/12 RHC/LHC  RA = 21 (steep y-descents)  RV = 47/8/21  PA = 45/20 (33)  PCW = 28  Fick cardiac output/index = 3.1/1.4  PVR = 1.5  FA sat = 93%  PA sat = 49%, 46%  SVR = 2037 dynes  Ao Pressure: 109/91 (100)  LV Pressure: 114/22/31  Normal coronaries. Severe NICM.   ECHO  08/01/2012 EF 40% Grade 1 diastolic dysfunction 07/12/13 ECHO EF 35-40%  CPX 08/04/13 FVC 4.06 (90%)  FEV1 3.56 (98%)  FEV1/FVC 88%  VO2 20 (62.3 % predicted), adjusted for IBW 25.1 VE/VCO2 26.6 OUES 2.73 Peak RER 0.94 VE/MVV: 37.5%  Follow up: Last visit increased hydralazine to 75 mg TID which he tolerated. He had a CPX test 08/04/13 results above.  Has not taken any meds today. Denies SOB, CP or edema. + DOE with moderate exertion or going up hills. Weight at home trending up, but no longer smoking. Trying to drink less than 2 L a day and following a low salt diet.     Labs:    08/04/13: K+ 3.8, Creatinine 1.3  ROS: All systems negative except as listed in HPI, PMH and Problem List.  Past Medical History  Diagnosis Date  . HTN (hypertension)     not taking medications daily  . CHF (congestive heart failure)     Sytolic, EF 20% by echo 04/11/12    Current Outpatient Prescriptions   Medication Sig Dispense Refill  . carvedilol (COREG) 25 MG tablet Take 1 tablet (25 mg total) by mouth 2 (two) times daily with a meal.  60 tablet  3  . digoxin (LANOXIN) 0.125 MG tablet Take 1 tablet (0.125 mg total) by mouth daily.  30 tablet  6  . furosemide (LASIX) 40 MG tablet Take 1 tablet (40 mg total) by mouth 2 (two) times daily.  60 tablet  6  . guaiFENesin (MUCINEX) 600 MG 12 hr tablet Take 1 tablet (600 mg total) by mouth 2 (two) times daily.  60 tablet  2  . hydrALAZINE (APRESOLINE) 50 MG tablet Take 1.5 tablets (75 mg total) by mouth every 8 (eight) hours.  105 tablet  4  . lisinopril (PRINIVIL,ZESTRIL) 10 MG tablet Take 1 tablet (10 mg total) by mouth 2 (two) times daily.  60 tablet  3  . spironolactone (ALDACTONE) 25 MG tablet Take 1 tablet (25 mg total) by mouth daily.  30 tablet  6  . allopurinol (ZYLOPRIM) 100 MG tablet Take 2 tablets (200 mg total) by mouth daily.  60 tablet  3   No current facility-administered medications for this encounter.    Filed Vitals:   08/11/13 1408  BP: 170/108  Pulse: 97  Weight: 235 lb 12.8 oz (106.958 kg)  SpO2: 98%   PHYSICAL EXAM: General:  Well appearing. No  resp difficulty  HEENT: normal L cheek scar noted. Neck: supple. JVP flat;  Carotids 2+ bilaterally; no bruits. No lymphadenopathy or thryomegaly appreciated. Cor: PMI normal. Regular rate & rhythm. No rubs or gallops. 2/6 MR Lungs: coarse bilateral bases Abdomen: obese, soft, nontender, nondistended. No hepatosplenomegaly. No bruits or masses. Good bowel sounds. Extremities: no cyanosis, clubbing, rash, edema.  Multiple tattoos bilateral upper extremities.  Neuro: alert & orientedx3, cranial nerves grossly intact. Moves all 4 extremities w/o difficulty. Affect pleasant.  ASSESSMENT & PLAN:  1. Chronic Systolic Heart Failure, NICM ECHO 07/2013 EF 40% - Patient is doing better. EF improving and has NYHA II-III symptoms. Volume status looks good will continue lasix 40 mg BID.  He is having weight gain, but it is not volume. - Coreg at goal dose 25 mg BID. Continue Spiro 25 mg daily, digoxin 0.125 and hydralazine 75 mg TID. He is not on IMDUR d/t headaches. - Will increase lisinopril to 40 mg daily and check a BMET next week.  - Reinforced the need and importance of daily weights, a low sodium diet, and fluid restriction (less than 2 L a day). Instructed to call the HF clinic if weight increases more than 3 lbs overnight or 5 lbs in a week.  - Dr Gala Romney discussed CPX test.  2. HTN - Remains elevated, however has not taken medications today. Will continue current dose of Spiro and BB. Increase lisinopril to 40 mg daily. Sent prescription to pharmacy. 3. Smoking - Reports no longer smoking and congratulated him on this progress.   4. Alcohol Abuse -Drinking 24 ounces of beer about once a week. Reinforced alcohol cessation.   F/U 2 months Ulla Potash B NP-C  2:17 PM  Patient seen and examined with Ulla Potash, NP. We discussed all aspects of the encounter. I agree with the assessment and plan as stated above. Echo and CPX reviewed personally. Clinically he continues to improve. We went over CPX test together and although submaximal effort (pRER = 0.94), there is no evidence of a significant cardio-pulmonary limitation. Agree with ongoing titration of meds and reinforced HF teaching. Will see back in 1-2 mos.   Daniel Bensimhon,MD 2:19 PM

## 2013-08-11 NOTE — Patient Instructions (Signed)
Increase lisinopril to 40 mg daily. You can take 2 (20 mg) tablets until you run out and new prescription will be 40 mg so then take 1 tablet daily.   Try to exercise and watch what you eat.  Come back next week for labs.  F/U 2 months.  Take meds next time before coming.  Do the following things EVERYDAY: 1) Weigh yourself in the morning before breakfast. Write it down and keep it in a log. 2) Take your medicines as prescribed 3) Eat low salt foods-Limit salt (sodium) to 2000 mg per day.  4) Stay as active as you can everyday 5) Limit all fluids for the day to less than 2 liters 6)

## 2013-10-04 ENCOUNTER — Ambulatory Visit (HOSPITAL_COMMUNITY): Admission: RE | Admit: 2013-10-04 | Payer: Self-pay | Source: Ambulatory Visit

## 2013-10-17 IMAGING — CR DG CHEST 2V
2 series · 2 of 2 positions shown · non-contrast
Comparison: 02/11/2012 and earlier.

CLINICAL DATA: 41-year-old male with shortness of breath and lower
extremity swelling.

CHEST - 2 VIEW

[w chest pa]
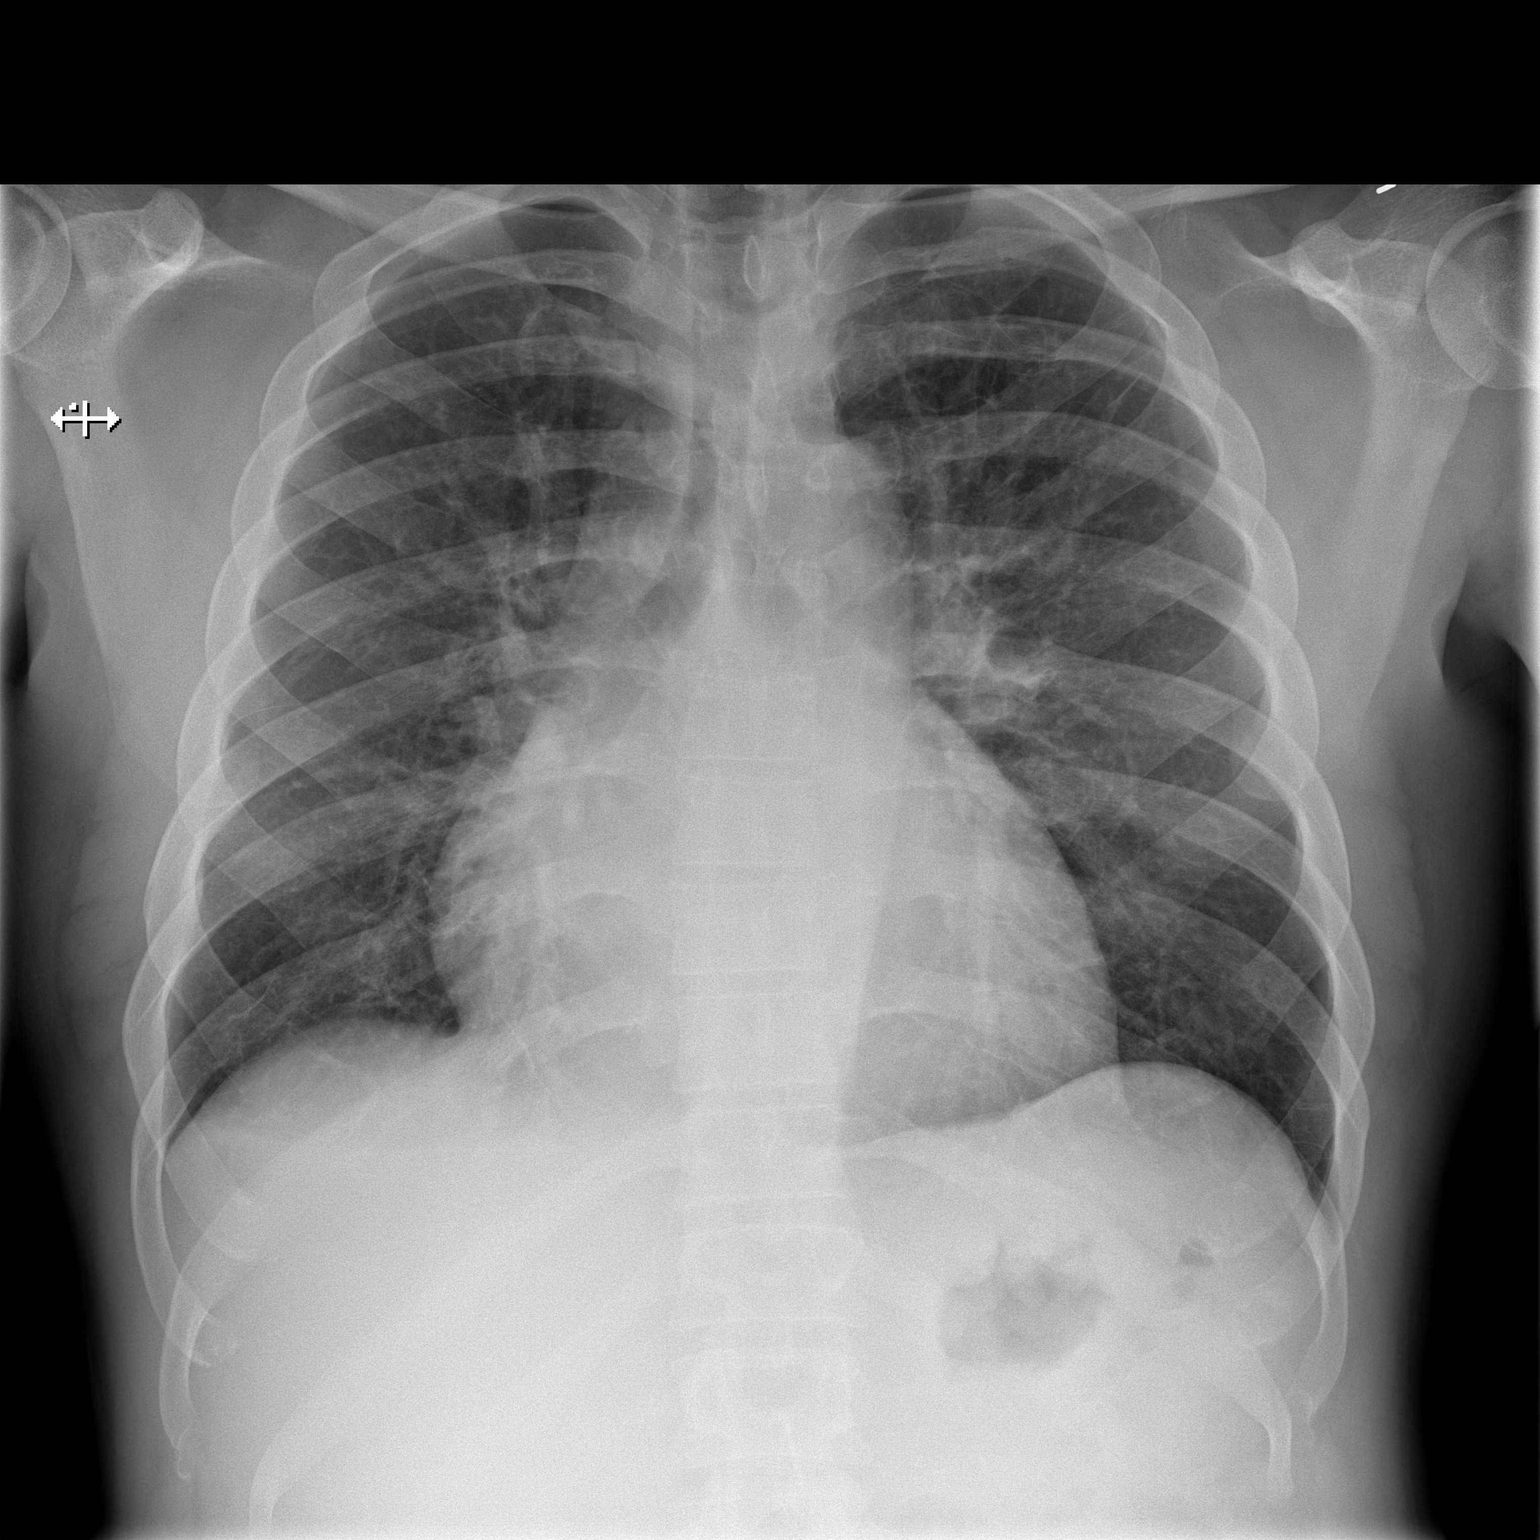

[w chest lat]
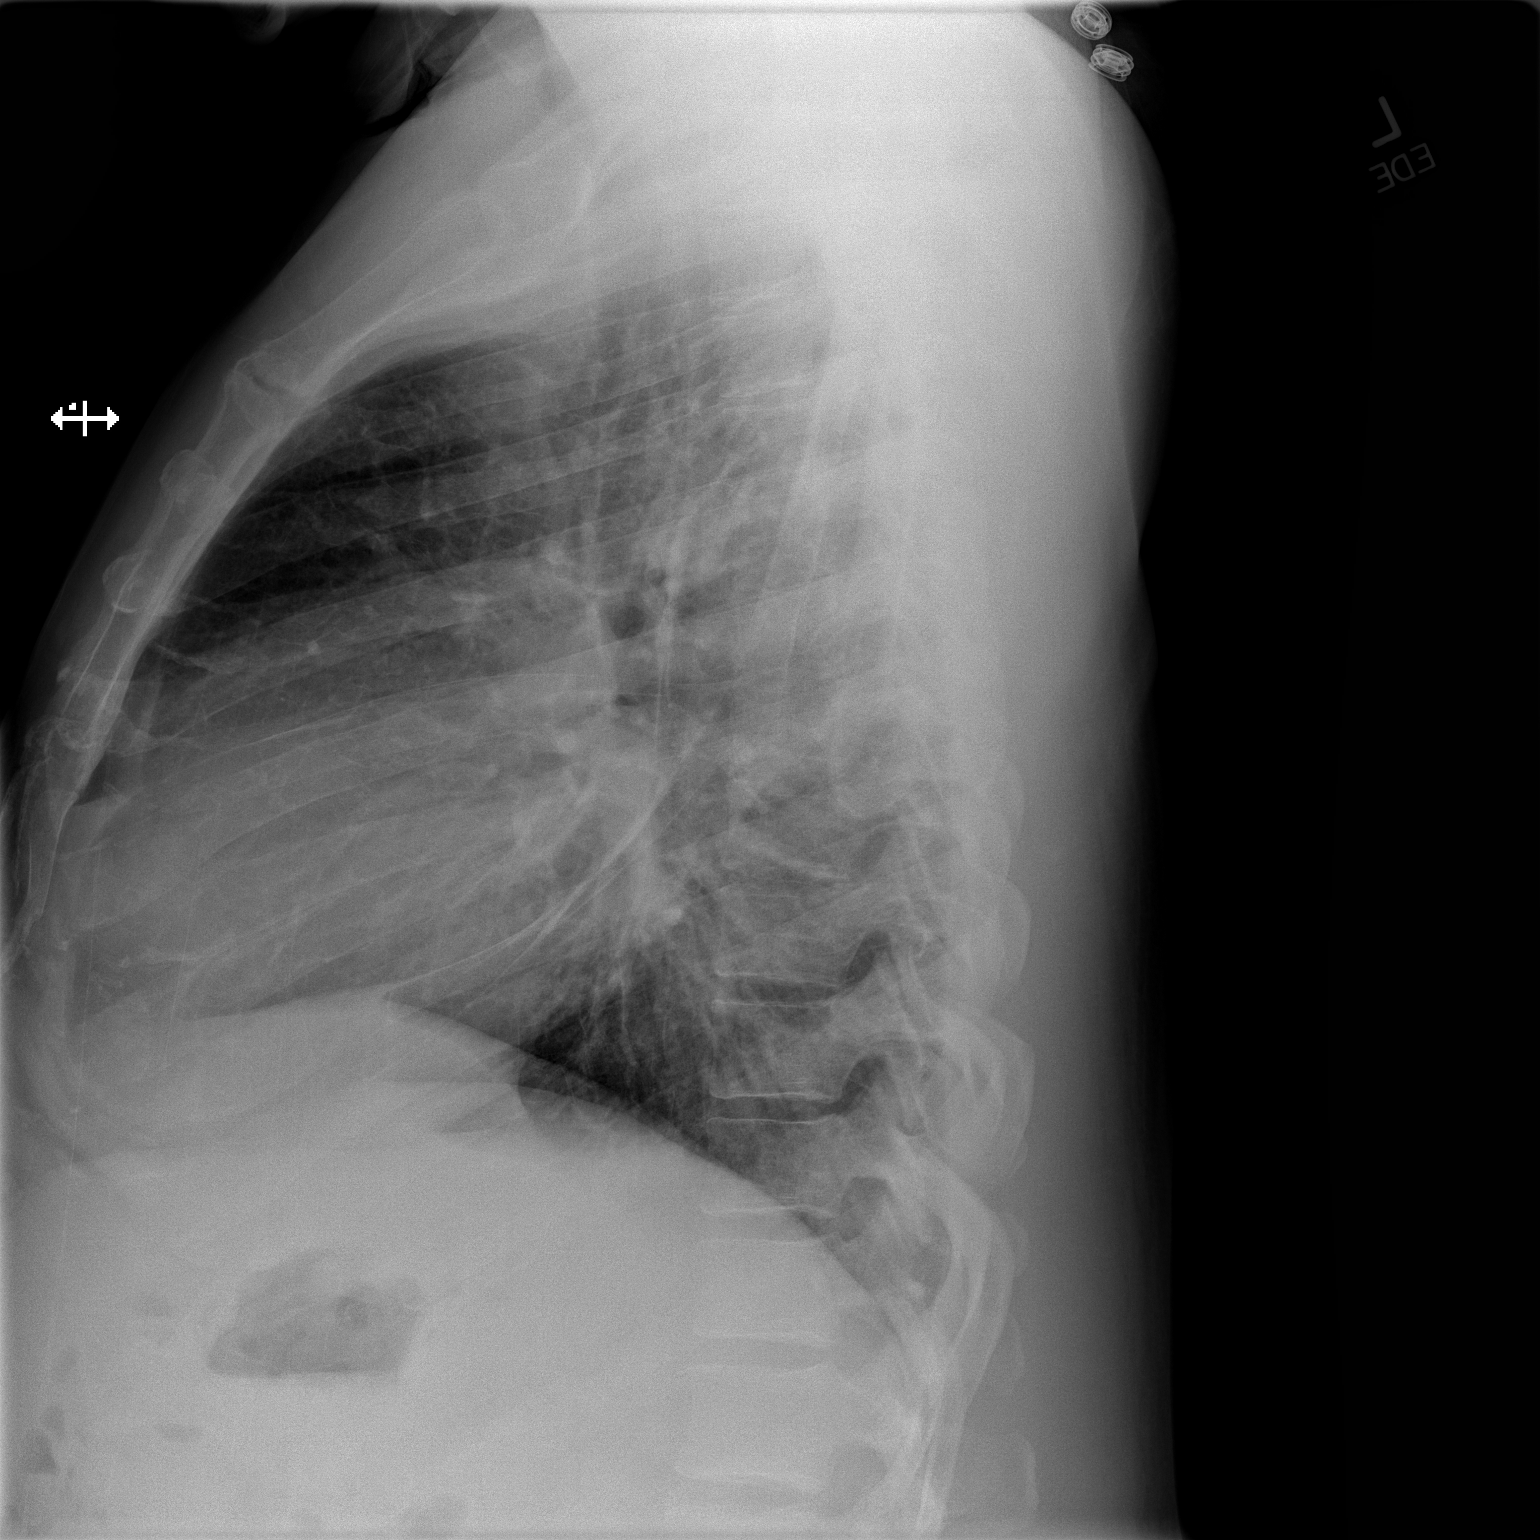

[2 of 2 positions shown; findings below may reference images not displayed]

FINDINGS: Stable cardiomegaly and mediastinal contours.  Trace fluid in the
pleural fissures and continued vascular congestion.  No overt
edema.  No pneumothorax or consolidation. Visualized tracheal air
column is within normal limits.  No acute osseous abnormality.
IMPRESSION: Stable cardiomegaly and vascular congestion without overt pulmonary
edema.

## 2013-10-25 ENCOUNTER — Ambulatory Visit (HOSPITAL_COMMUNITY)
Admission: RE | Admit: 2013-10-25 | Discharge: 2013-10-25 | Disposition: A | Discharge status: Home / Self Care | Payer: Self-pay | Source: Ambulatory Visit | Attending: Internal Medicine | Admitting: Internal Medicine

## 2013-10-25 ENCOUNTER — Encounter: Payer: Self-pay | Admitting: Internal Medicine

## 2013-10-25 VITALS — BP 164/98 | HR 92 | Wt 240.2 lb

## 2013-10-25 DIAGNOSIS — I5022 Chronic systolic (congestive) heart failure: Secondary | ICD-10-CM | POA: Insufficient documentation

## 2013-10-25 DIAGNOSIS — F1010 Alcohol abuse, uncomplicated: Secondary | ICD-10-CM | POA: Insufficient documentation

## 2013-10-25 DIAGNOSIS — I10 Essential (primary) hypertension: Secondary | ICD-10-CM | POA: Insufficient documentation

## 2013-10-25 MED ORDER — HYDRALAZINE HCL 50 MG PO TABS: 75.0000 mg | ORAL_TABLET | Freq: Three times a day (TID) | ORAL | Status: DC

## 2013-10-25 NOTE — Patient Instructions (Signed)
Follow up in 3 weeks  Take hydralazine to 75 mg three times a day  Do the following things EVERYDAY: 1) Weigh yourself in the morning before breakfast. Write it down and keep it in a log. 2) Take your medicines as prescribed 3) Eat low salt foods-Limit salt (sodium) to 2000 mg per day.  4) Stay as active as you can everyday 5) Limit all fluids for the day to less than 2 liters

## 2013-10-25 NOTE — Progress Notes (Signed)
Patient ID: Courvoisier Vredeveld, male   DOB: [birth year], 43 y.o.   MRN: [id number]   HPI: 43 year old gentlemen with severe untreated hypertension, ETOH use and chronic systolic heart failure due to NICM with EF 20%.   Enrolled in Guide-IT trial: usual care . Intolerant IMDUR due to headaches.   Admitted to [facility] 04/11/12 due to progressive dyspnea on exertion. Admit Pro BNP 5087. 04/12/12 ECHO EF 20%. Due to cardiogenic shock, Milrionone was initiated via PICC. Due to renal insufficiency, hydralazine and IMDUR aggressively titrated. Renal function continued to improve while on Milrinone (Creatinine 1.46>1.41>1.35>1.08>1.10>0.96> 1.03). Placed life vest prior to discharge.   Discharge Pro BNP 330. D/C weight 198 pounds.   04/15/12 RHC/LHC  RA = 21 (steep y-descents)  RV = 47/8/21  PA = 45/20 (33)  PCW = 28  Fick cardiac output/index = 3.1/1.4  PVR = 1.5  FA sat = 93%  PA sat = 49%, 46%  SVR = 2037 dynes  Ao Pressure: 109/91 (100)  LV Pressure: 114/22/31  Normal coronaries. Severe NICM.   ECHO  08/01/2012 EF 40% Grade 1 diastolic dysfunction 07/12/13 ECHO EF 35-40%  CPX 08/04/13 FVC 4.06 (90%)  FEV1 3.56 (98%)  FEV1/FVC 88%  VO2 20 (62.3 % predicted), adjusted for IBW 25.1 VE/VCO2 26.6 OUES 2.73 Peak RER 0.94 VE/MVV: 37.5%  He returns for follow up. Last visit lisinopril was increased to 40 mg daily. Says he misses taking lasix a day or two but otherwise he is taking it twice a day. Denies PND/Orthopnea. SOB with steps. Able to walk 1 block without dyspnea. Weight at home 230-235 pounds. Not smoking. Had a few beers over the holiday.   Labs:     08/04/13: K+ 3.8, Creatinine 1.3     08/11/13 K 3.9 Creatinine 1.4  ROS: All systems negative except as listed in HPI, PMH and Problem List.  Past Medical History  Diagnosis Date  . HTN (hypertension)     not taking medications daily  . CHF (congestive heart failure)     Sytolic, EF 20% by echo 04/11/12    Current Outpatient  Prescriptions  Medication Sig Dispense Refill  . allopurinol (ZYLOPRIM) 100 MG tablet Take 2 tablets (200 mg total) by mouth daily.  60 tablet  3  . carvedilol (COREG) 25 MG tablet Take 1 tablet (25 mg total) by mouth 2 (two) times daily with a meal.  60 tablet  3  . digoxin (LANOXIN) 0.125 MG tablet Take 1 tablet (0.125 mg total) by mouth daily.  30 tablet  6  . furosemide (LASIX) 40 MG tablet Take 1 tablet (40 mg total) by mouth 2 (two) times daily.  60 tablet  6  . guaiFENesin (MUCINEX) 600 MG 12 hr tablet Take 600 mg by mouth 2 (two) times daily as needed.      . hydrALAZINE (APRESOLINE) 50 MG tablet Take 50 mg by mouth every 8 (eight) hours.      Marland Kitchen lisinopril (PRINIVIL,ZESTRIL) 40 MG tablet Take 1 tablet (40 mg total) by mouth 2 (two) times daily.  30 tablet  3  . spironolactone (ALDACTONE) 25 MG tablet Take 1 tablet (25 mg total) by mouth daily.  30 tablet  6   No current facility-administered medications for this encounter.    Filed Vitals:   10/25/13 1407  BP: 164/98  Pulse: 92  Weight: 240 lb 4 oz (108.977 kg)  SpO2: 99%   PHYSICAL EXAM: General:  Well appearing. No resp difficulty  HEENT: normal  L cheek scar noted. Neck: supple. JVP flat;  Carotids 2+ bilaterally; no bruits. No lymphadenopathy or thryomegaly appreciated. Cor: PMI normal. Regular rate & rhythm. No rubs or gallops. 2/6 MR Lungs: coarse bilateral bases Abdomen: obese, soft, nontender, nondistended. No hepatosplenomegaly. No bruits or masses. Good bowel sounds. Extremities: no cyanosis, clubbing, rash, edema.  Multiple tattoos bilateral upper extremities.  Neuro: alert & orientedx3, cranial nerves grossly intact. Moves all 4 extremities w/o difficulty. Affect pleasant.  ASSESSMENT & PLAN:  1. Chronic Systolic Heart Failure, NICM ECHO 07/2013 EF 40% NYHA II  -  Volume status stable. Continue lasix 40 mg twice a day and spironolactone 25 mg daily.  On goal dose carvedilol 25 mg twice a day.Continue digoxin  0.125 mg daily.  Increased hydralazine to 75 mg tid-not on Imdur due to headaches. Continue lisinopril 40 mg daily.  - Reinforced the need and importance of daily weights, a low sodium diet, and fluid restriction (less than 2 L a day). Instructed to call the HF clinic if weight increases more than 3 lbs overnight or 5 lbs in a week.  Check BMET/Pro BNP per Guide It 2. HTN- Remains elevated, however has not taken medications today. Will continue current dose of Spiro, BB, and Ace. Increased hydralazine to 75 mg tid.  4. Alcohol Abuse-Drinking 3-4 beers a week. Reinforced alcohol cessation.   F/U 2 months  Bobbyjo Marulanda NP-C  2:24 PM

## 2013-11-09 IMAGING — CR DG CHEST 2V
2 series · 2 of 2 positions shown · non-contrast
Comparison: Two-view chest x-ray 03/19/2012, 02/11/2012,
11/24/2010, 07/23/2010.

CLINICAL DATA: Shortness of breath.  Chest congestion.  Smoker with
history of hypertension.

CHEST - 2 VIEW

[w chest pa]
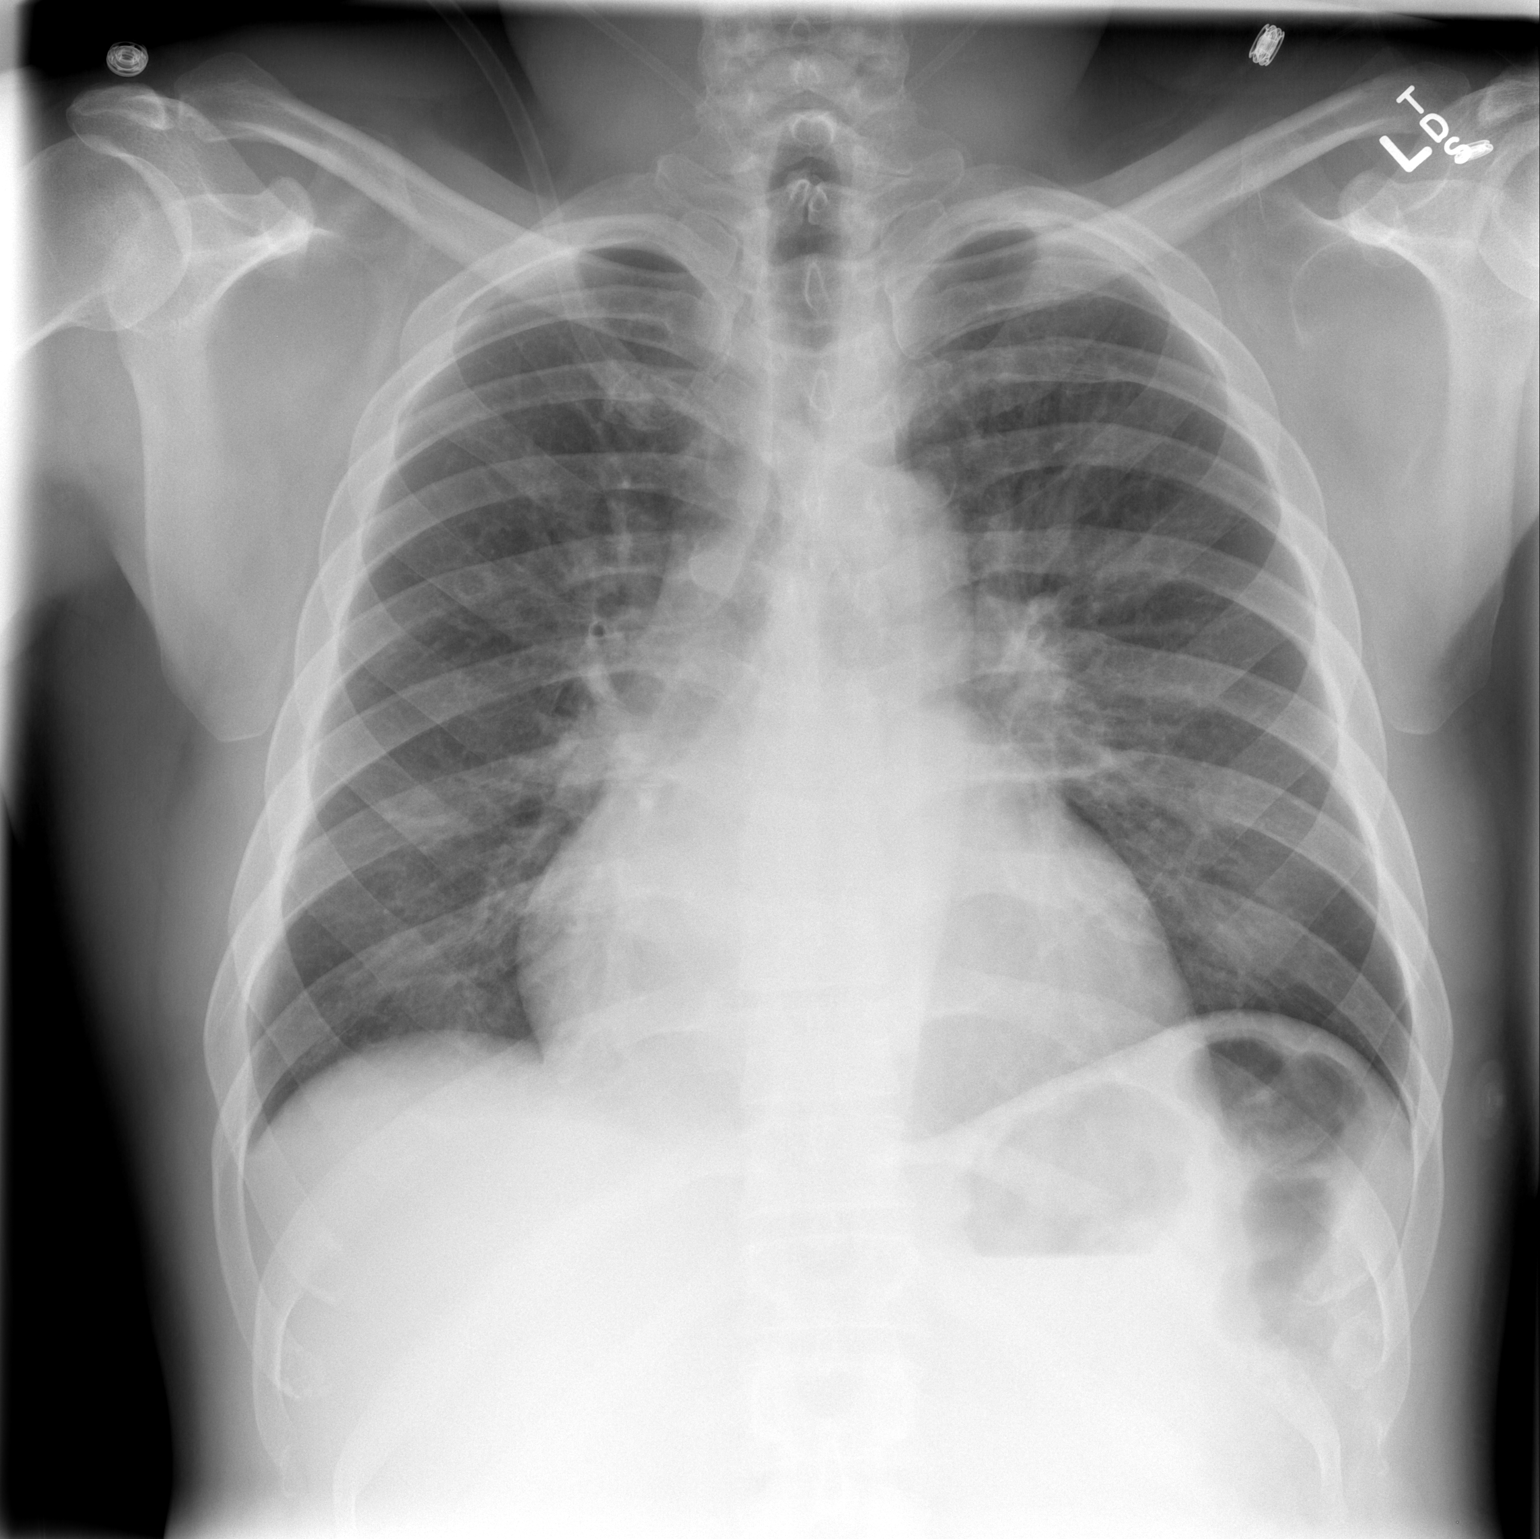

[w chest lat]
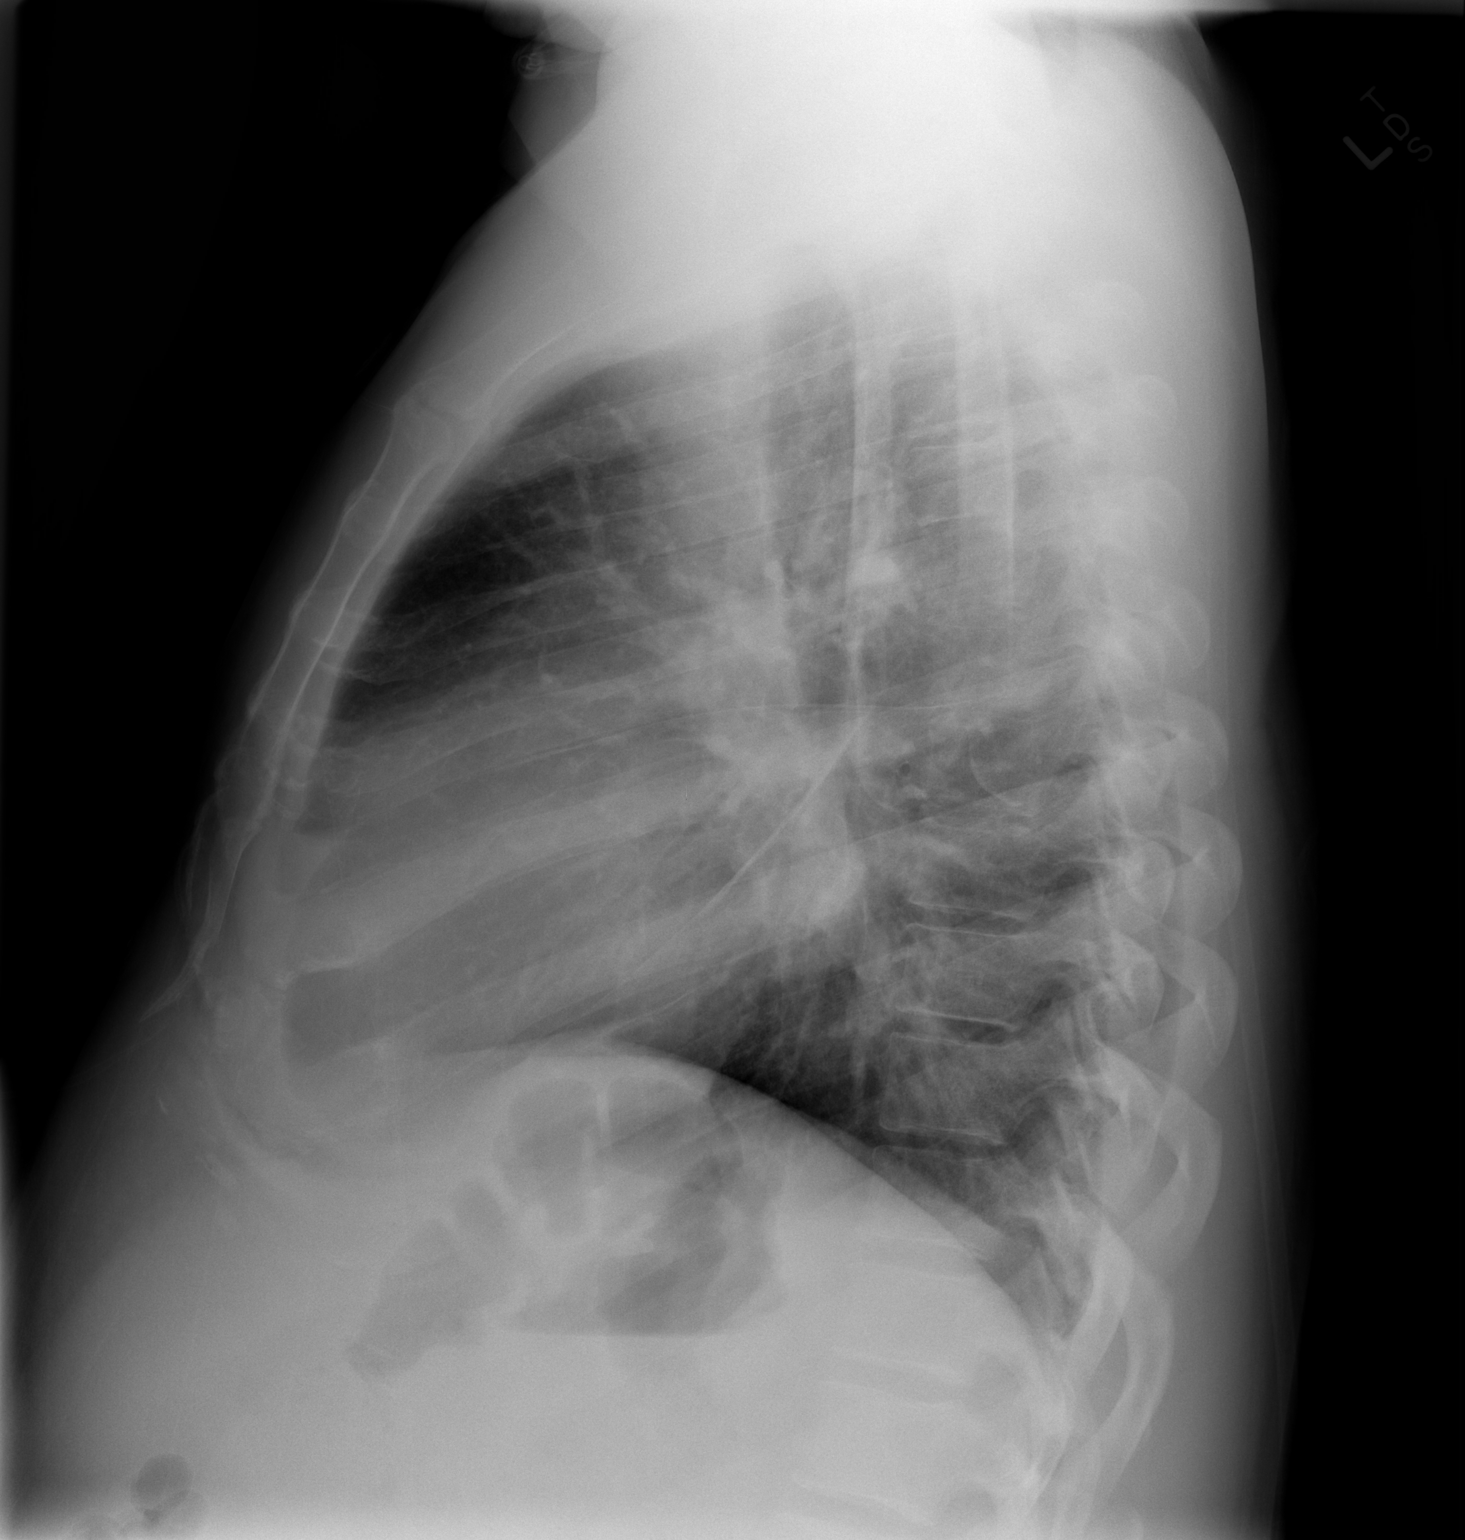

[2 of 2 positions shown; findings below may reference images not displayed]

FINDINGS: Cardiac silhouette enlarged, with interval increase in
the heart size since 6806.  Hilar and mediastinal contours
otherwise unremarkable.  Mild pulmonary venous hypertension, less
than on the recent prior examinations.  Subtle airspace opacity in
the central left upper lobe, in the region of the left hilum.
Lungs otherwise clear.  Pulmonary vascularity normal.  No pleural
effusions.  Visualized bony thorax intact.
IMPRESSION: Subtle central left upper lobe pneumonia suspected.  Cardiomegaly
without evidence of pulmonary edema.

## 2013-11-11 IMAGING — CR DG CHEST 2V
2 series · 2 of 2 positions shown · non-contrast
Comparison: 04/11/2012

CLINICAL DATA: Shortness of breath, fever, follow-up pulmonary
edema

CHEST - 2 VIEW

[w chest pa (1 of 2)]
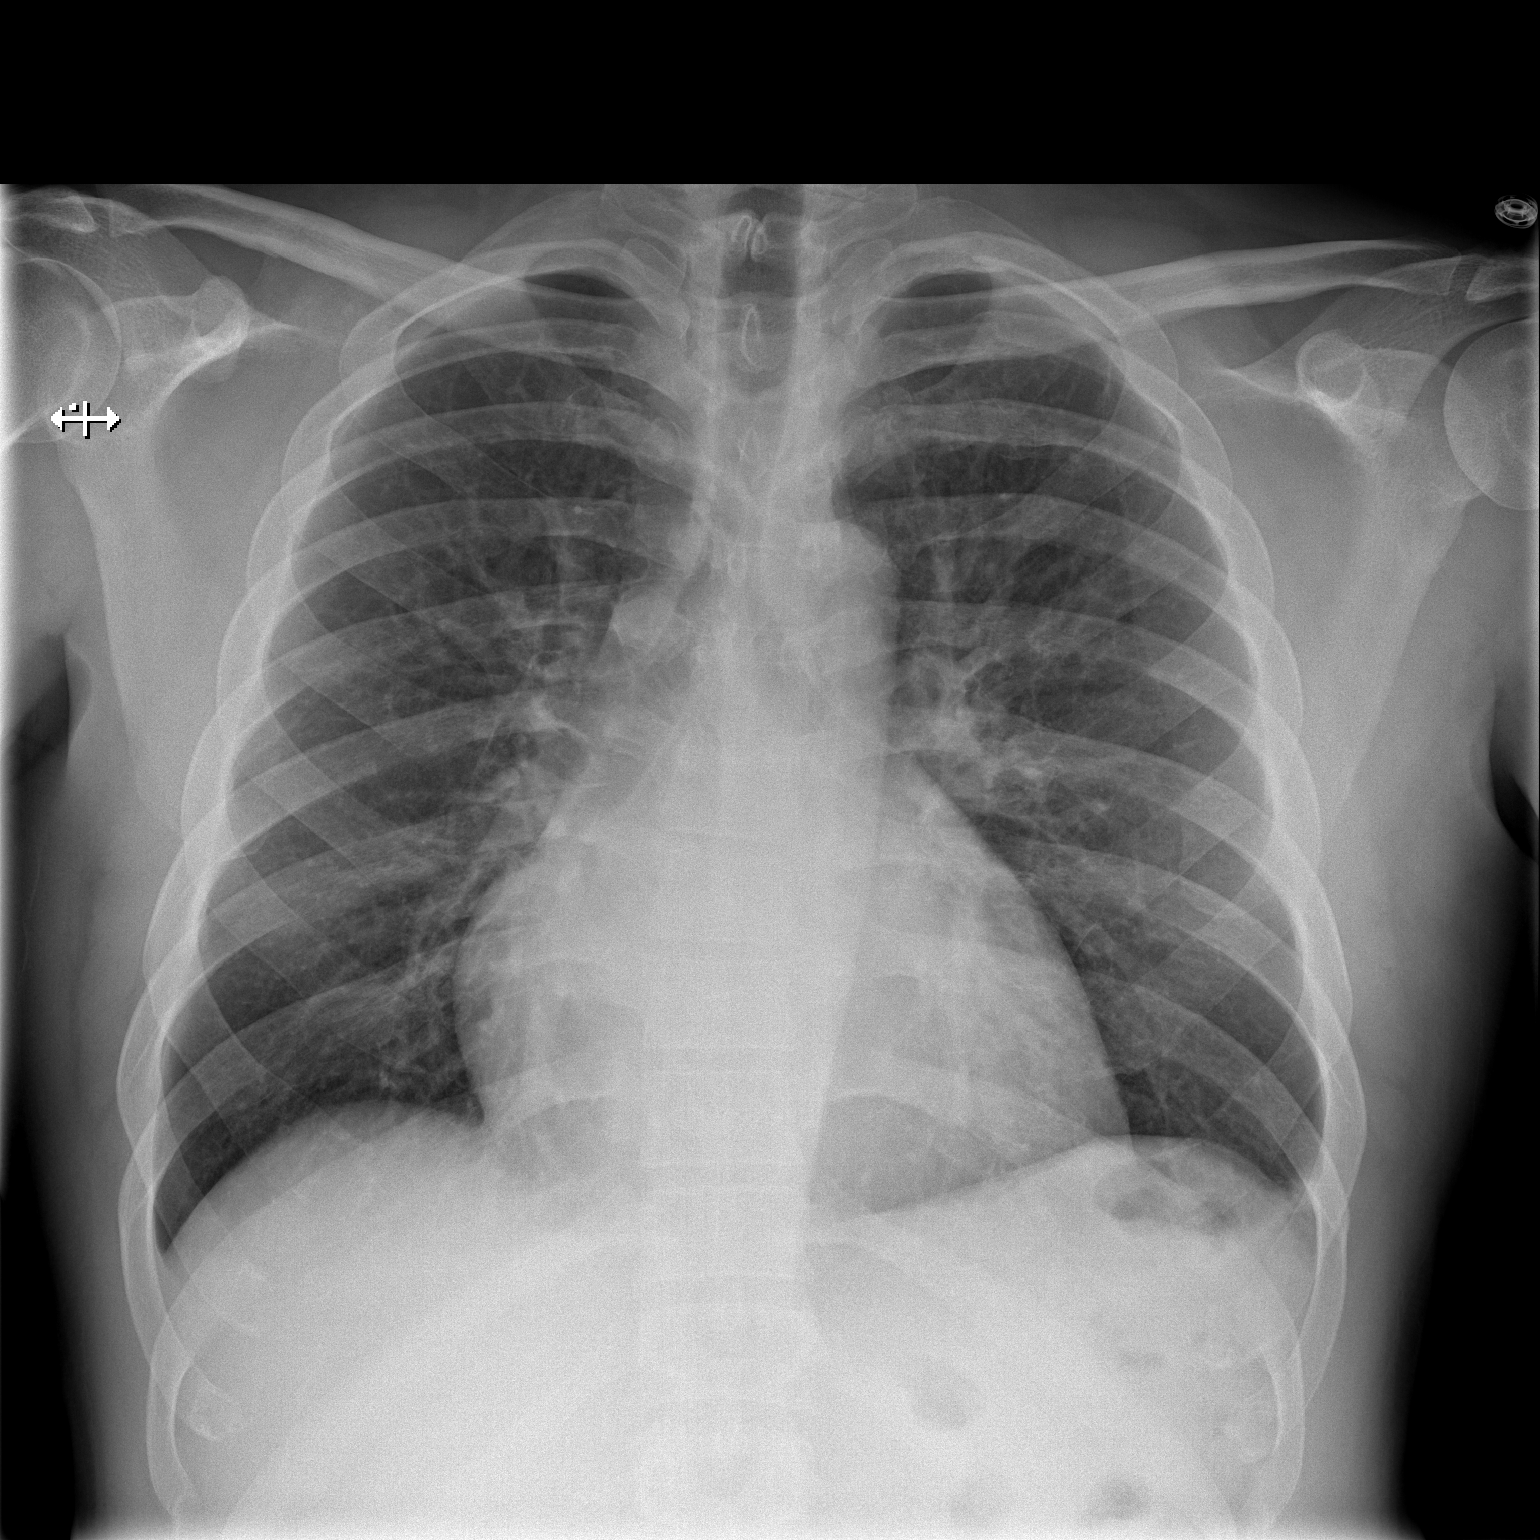

[w chest pa (2 of 2)]
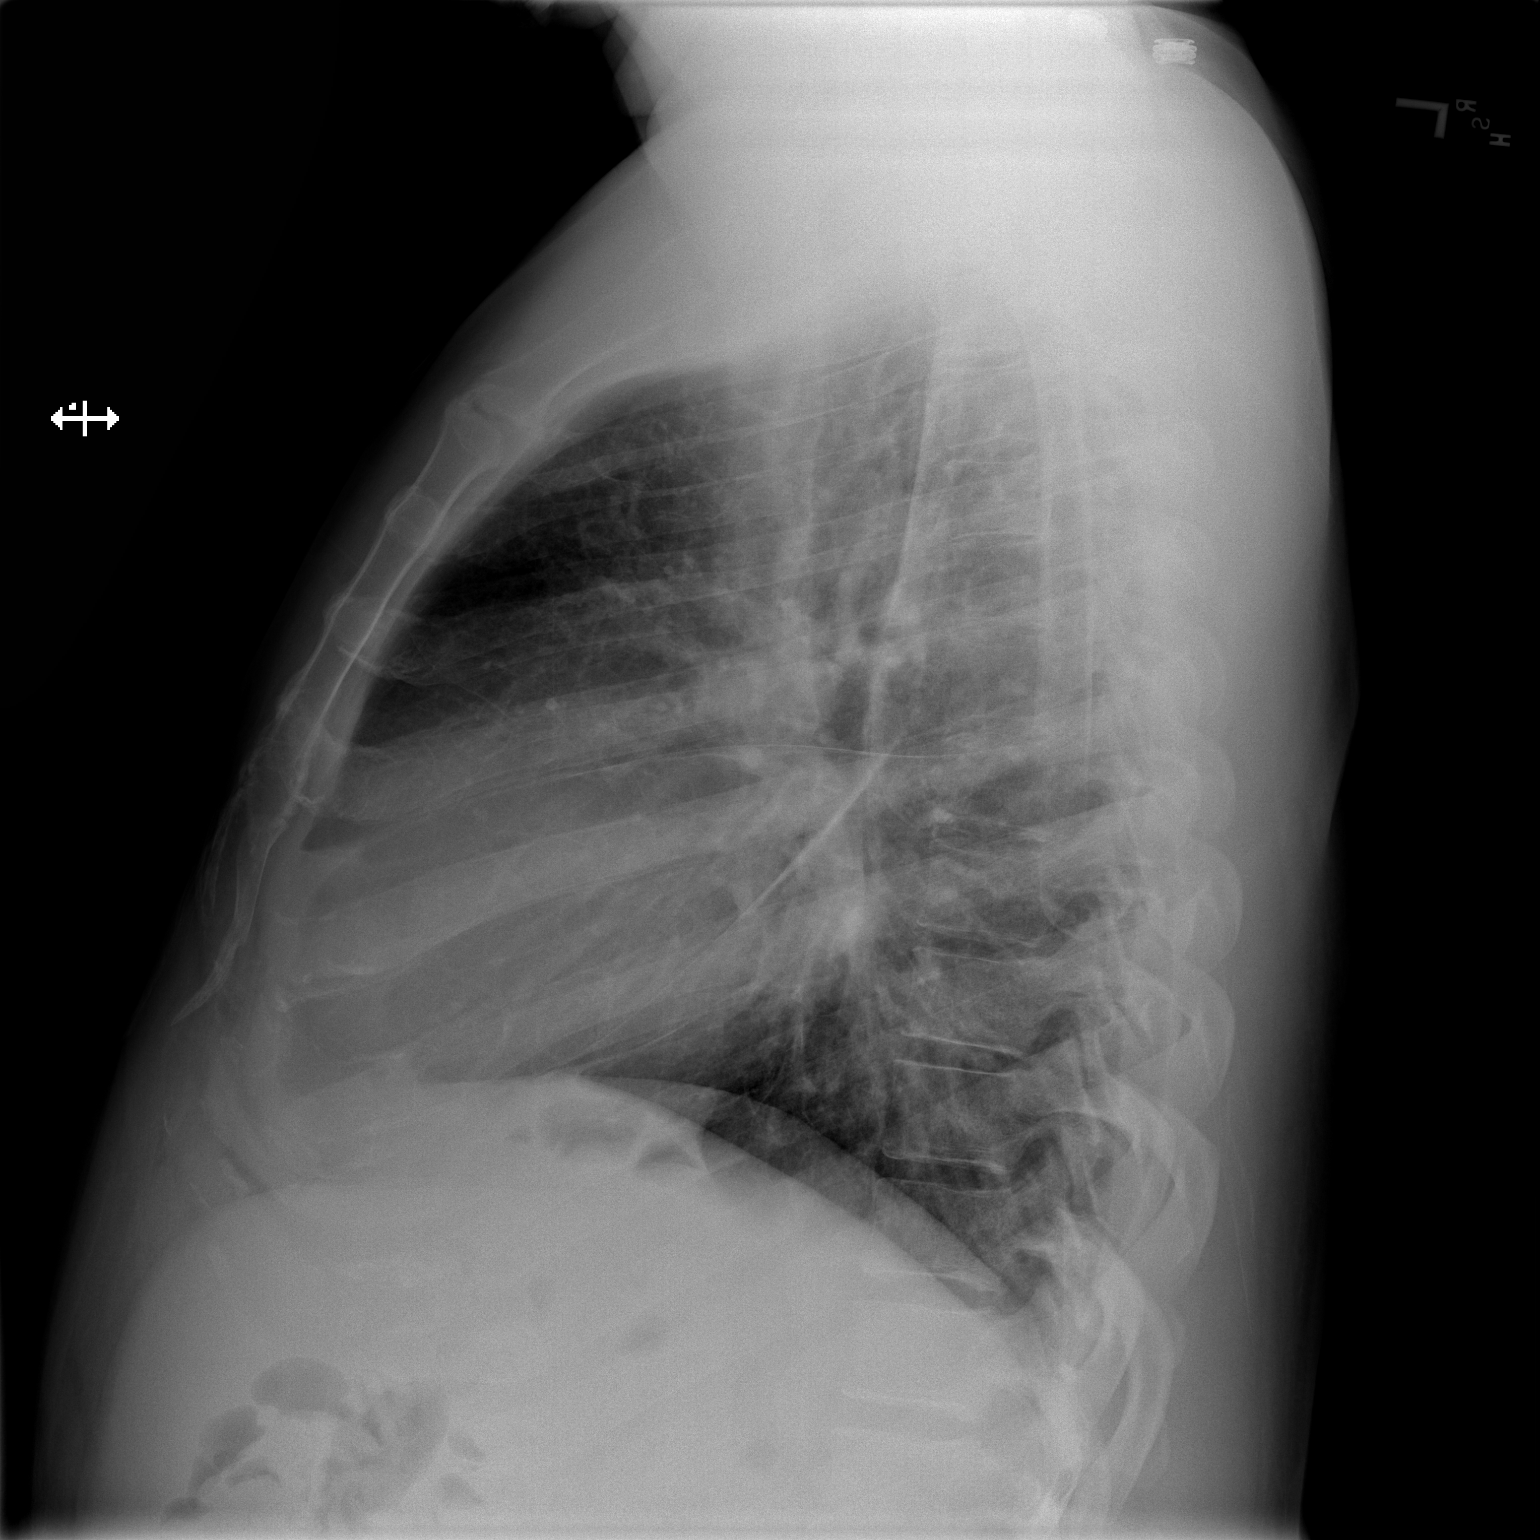

[2 of 2 positions shown; findings below may reference images not displayed]

FINDINGS: Borderline enlargement of cardiac silhouette.
Slight pulmonary vascular congestion.
Mediastinal contours stable.
Peribronchial thickening with decrease in left perihilar markings
question improving infiltrate.
Remaining lungs clear.
No pleural effusion or pneumothorax.
No acute osseous findings.
IMPRESSION: Minimal bronchitic changes.
Decrease in left perihilar markings question improving infiltrate
in left upper lobe.

## 2013-11-14 ENCOUNTER — Encounter (HOSPITAL_COMMUNITY): Payer: Self-pay

## 2013-11-14 ENCOUNTER — Encounter: Payer: Self-pay | Admitting: Internal Medicine

## 2013-11-28 ENCOUNTER — Ambulatory Visit (HOSPITAL_COMMUNITY)
Admission: RE | Admit: 2013-11-28 | Discharge: 2013-11-28 | Disposition: A | Discharge status: Home / Self Care | Payer: Medicaid Other | Source: Ambulatory Visit | Attending: Internal Medicine | Admitting: Internal Medicine

## 2013-11-28 VITALS — BP 164/102 | HR 94 | Wt 235.0 lb

## 2013-11-28 DIAGNOSIS — I5022 Chronic systolic (congestive) heart failure: Secondary | ICD-10-CM

## 2013-11-28 DIAGNOSIS — I5032 Chronic diastolic (congestive) heart failure: Secondary | ICD-10-CM | POA: Insufficient documentation

## 2013-11-28 DIAGNOSIS — I428 Other cardiomyopathies: Secondary | ICD-10-CM | POA: Insufficient documentation

## 2013-11-28 DIAGNOSIS — I10 Essential (primary) hypertension: Secondary | ICD-10-CM | POA: Insufficient documentation

## 2013-11-28 NOTE — Progress Notes (Signed)
Patient ID: Rayansh Boise, male   DOB: April 10, 1971, 43 y.o.   MRN: [id number]   HPI: 43 year old gentlemen with severe untreated hypertension, ETOH use and chronic systolic heart failure due to NICM with EF 20%.   Enrolled in Guide-IT trial: usual care . Intolerant IMDUR due to headaches.   Admitted to [facility] 04/11/12 due to progressive dyspnea on exertion. Admit Pro BNP 5087. 04/12/12 ECHO EF 20%. Due to cardiogenic shock, Milrionone was initiated via PICC. Due to renal insufficiency, hydralazine and IMDUR aggressively titrated. Renal function continued to improve while on Milrinone (Creatinine 1.46>1.41>1.35>1.08>1.10>0.96> 1.03). Placed life vest prior to discharge.   Discharge Pro BNP 330. D/C weight 198 pounds.   04/15/12 RHC/LHC  RA = 21 (steep y-descents)  RV = 47/8/21  PA = 45/20 (33)  PCW = 28  Fick cardiac output/index = 3.1/1.4  PVR = 1.5  FA sat = 93%  PA sat = 49%, 46%  SVR = 2037 dynes  Ao Pressure: 109/91 (100)  LV Pressure: 114/22/31  Normal coronaries. Severe NICM.   ECHO  08/01/2012 EF 40% Grade 1 diastolic dysfunction 07/12/13 ECHO EF 35-40%  CPX 08/04/13 FVC 4.06 (90%)  FEV1 3.56 (98%)  FEV1/FVC 88%  VO2 20 (62.3 % predicted), adjusted for IBW 25.1 VE/VCO2 26.6 OUES 2.73 Peak RER 0.94 VE/MVV: 37.5%  He returns for follow up. Hydralazine increased to 75 tid - not on Imdur due to HAs. Feels good. Mid SOB with steps. No edema, orthopnea, PND or CP. Says he is compliant with meds (didnt take afternoon meds yet today). Weight at home stable around 230-235. Has a BP cuff at home but not recording regularly.   Labs:     08/04/13: K+ 3.8, Creatinine 1.3     08/11/13 K 3.9 Creatinine 1.4  ROS: All systems negative except as listed in HPI, PMH and Problem List.  Past Medical History  Diagnosis Date  . HTN (hypertension)     not taking medications daily  . CHF (congestive heart failure)     Sytolic, EF 20% by echo 04/11/12    Current Outpatient Prescriptions   Medication Sig Dispense Refill  . allopurinol (ZYLOPRIM) 100 MG tablet Take 2 tablets (200 mg total) by mouth daily.  60 tablet  3  . carvedilol (COREG) 25 MG tablet Take 1 tablet (25 mg total) by mouth 2 (two) times daily with a meal.  60 tablet  3  . digoxin (LANOXIN) 0.125 MG tablet Take 1 tablet (0.125 mg total) by mouth daily.  30 tablet  6  . furosemide (LASIX) 40 MG tablet Take 1 tablet (40 mg total) by mouth 2 (two) times daily.  60 tablet  6  . guaiFENesin (MUCINEX) 600 MG 12 hr tablet Take 600 mg by mouth 2 (two) times daily as needed.      . hydrALAZINE (APRESOLINE) 50 MG tablet Take 1.5 tablets (75 mg total) by mouth every 8 (eight) hours.  135 tablet  6  . lisinopril (PRINIVIL,ZESTRIL) 40 MG tablet Take 1 tablet (40 mg total) by mouth 2 (two) times daily.  30 tablet  3  . spironolactone (ALDACTONE) 25 MG tablet Take 1 tablet (25 mg total) by mouth daily.  30 tablet  6   No current facility-administered medications for this encounter.    Filed Vitals:   11/28/13 1450  BP: 164/102  Pulse: 94  Weight: 235 lb (106.595 kg)  SpO2: 98%   PHYSICAL EXAM: General:  Well appearing. No resp difficulty  HEENT:  normal L cheek scar noted. Neck: supple. JVP flat;  Carotids 2+ bilaterally; no bruits. No lymphadenopathy or thryomegaly appreciated. Cor: PMI normal. Regular rate & rhythm. No rubs or gallops. 2/6 MR Lungs: coarse bilateral bases Abdomen: obese, soft, nontender, nondistended. No hepatosplenomegaly. No bruits or masses. Good bowel sounds. Extremities: no cyanosis, clubbing, rash, edema.  Multiple tattoos bilateral upper extremities.  Neuro: alert & orientedx3, cranial nerves grossly intact. Moves all 4 extremities w/o difficulty. Affect pleasant.  ASSESSMENT & PLAN:  1. Chronic Systolic Heart Failure, NICM ECHO 07/2013 EF 40% NYHA II  - Stable from a HF perspective NYHA II. Volume status stable -  Will continue current regimen/ 2. HTN- Remains markedly elevated.  -- Will  add amlodipine 10 mg daily  -- Have asked him to keep a BP log taking BP once a day every day at different times. Bring log to next visit.  --Suspect he will need sleep study.  --Cannot tolerate clonidine  3. Alcohol Abuse-Drinking 2-3 beers a week. Reinforced need for alcohol cessation.   F/U 2 months  Arvilla Meresaniel Bensimhon MD  3:13 PM

## 2013-11-28 NOTE — Addendum Note (Signed)
Encounter addended by: Noralee Space, RN on: 11/28/2013  3:29 PM<BR>     Documentation filed: Patient Instructions Section

## 2013-11-28 NOTE — Patient Instructions (Signed)
Start Amlodipine 10 mg daily  Your physician recommends that you schedule a follow-up appointment in: 3-4

## 2013-11-30 ENCOUNTER — Telehealth: Payer: Self-pay | Admitting: Licensed Clinical Social Worker

## 2013-11-30 NOTE — Telephone Encounter (Signed)
CSW received referral to assist patient with community resources. CSW attempted to reach patient via phone although number listed in chart is not valid. CSW will attempt to follow up with patient on next clinic visit 12/19/13. CSW informed clinic staff of inability to contact due to invalid number. Lasandra Beech, LCSW [phone]

## 2013-12-09 ENCOUNTER — Telehealth (HOSPITAL_COMMUNITY): Payer: Self-pay | Admitting: *Deleted

## 2013-12-09 NOTE — Telephone Encounter (Signed)
Pt had labs done at appt 2/9 with [facility], bun 11 cr 1.56 per Ulla Potash, NP hold lasix for 1 day then resume, have attempted to call pt multiple times since then # listed in chart is incorrect and VM has been left with his emergency contact but still have not heard from pt, he is sch for f/u appt 3/2

## 2013-12-19 ENCOUNTER — Encounter (HOSPITAL_COMMUNITY): Payer: Medicaid Other

## 2013-12-30 ENCOUNTER — Encounter: Payer: Self-pay | Admitting: Internal Medicine

## 2014-01-13 ENCOUNTER — Inpatient Hospital Stay (HOSPITAL_COMMUNITY): Admission: RE | Admit: 2014-01-13 | Payer: Medicaid Other | Source: Ambulatory Visit

## 2014-01-13 ENCOUNTER — Encounter (HOSPITAL_COMMUNITY): Payer: Self-pay

## 2014-02-09 ENCOUNTER — Inpatient Hospital Stay (HOSPITAL_COMMUNITY): Admission: RE | Admit: 2014-02-09 | Payer: Medicaid Other | Source: Ambulatory Visit

## 2014-02-22 ENCOUNTER — Encounter (HOSPITAL_COMMUNITY): Payer: Medicaid Other

## 2014-02-22 ENCOUNTER — Encounter (HOSPITAL_COMMUNITY): Payer: Self-pay

## 2014-02-23 ENCOUNTER — Encounter (HOSPITAL_COMMUNITY): Payer: Self-pay | Admitting: Adult Health

## 2014-02-23 NOTE — Progress Notes (Signed)
Patient ID: Jeffrey Murray, male   DOB: 08/21/1971, 43 y.o.   MRN: [id number]   Called to reschedule appointment.   Scheduled follow up May 11 at 1:00  Will ask HFSW to see him.   Ray Glacken D Krystale Rinkenberger 2:11 PM

## 2014-02-26 NOTE — Progress Notes (Addendum)
Patient ID: Jeffrey Murray, male   DOB: 08/30/1971, 43 y.o.   MRN: [id number]   HPI: 43 year old gentlemenemen with severe untreated hypertension, ETOH use and chronic systolic heart failure due to NICM with EF 20%.   Enrolled in Guide-IT trial: usual care . Intolerant IMDUR due to headaches.   Admitted to [facility] 04/11/12 due to progressive dyspnea on exertion. Admit Pro BNP 5087. 04/12/12 ECHO EF 20%. Due to cardiogenic shock, Milrionone was initiated via PICC. Due to renal insufficiency, hydralazine and IMDUR aggressively titrated. Renal function continued to improve while on Milrinone (Creatinine 1.46>1.41>1.35>1.08>1.10>0.96> 1.03). Placed life vest prior to discharge.   Discharge Pro BNP 330. D/C weight 198 pounds.   HE returns for follow up with his wife.  He has been out of lisinopril and spironolactone.  Mild dyspnea with exertion. Denies PND/Orthopnea. Able walk up steps slowly. He misses lasix dose 1-2 time a week.  Drinking about 8 beers about 4 days a week. Weight at home 215-220 pounds. Not exercising. Drinking > 2 liters of fluid.   04/15/12 RHC/LHC  RA = 21 (steep y-descents)  RV = 47/8/21  PA = 45/20 (33)  PCW = 28  Fick cardiac output/index = 3.1/1.4  PVR = 1.5  FA sat = 93%  PA sat = 49%, 46%  SVR = 2037 dynes  Ao Pressure: 109/91 (100)  LV Pressure: 114/22/31  Normal coronaries. Severe NICM.   ECHO  08/01/2012 EF 40% Grade 1 diastolic dysfunction 07/12/13 ECHO EF 35-40%  CPX 08/04/13 FVC 4.06 (90%)  FEV1 3.56 (98%)  FEV1/FVC 88%  VO2 20 (62.3 % predicted), adjusted for IBW 25.1 VE/VCO2 26.6 OUES 2.73 Peak RER 0.94 VE/MVV: 37.5%  Labs:     08/04/13: K+ 3.8, Creatinine 1.3     08/11/13 K 3.9 Creatinine 1.4  ROS: All systems negative except as listed in HPI, PMH and Problem List.  Past Medical History  Diagnosis Date  . HTN (hypertension)     not taking medications daily  . CHF (congestive heart failure)     Sytolic, EF 20% by echo 04/11/12    Current  Outpatient Prescriptions  Medication Sig Dispense Refill  . carvedilol (COREG) 25 MG tablet Take 1 tablet (25 mg total) by mouth 2 (two) times daily with a meal.  60 tablet  3  . digoxin (LANOXIN) 0.125 MG tablet Take 1 tablet (0.125 mg total) by mouth daily.  30 tablet  6  . furosemide (LASIX) 40 MG tablet Take 1 tablet (40 mg total) by mouth 2 (two) times daily.  60 tablet  6  . hydrALAZINE (APRESOLINE) 50 MG tablet Take 1.5 tablets (75 mg total) by mouth every 8 (eight) hours.  135 tablet  6  . lisinopril (PRINIVIL,ZESTRIL) 40 MG tablet Take 40 mg by mouth daily.      Marland Kitchen spironolactone (ALDACTONE) 25 MG tablet Take 1 tablet (25 mg total) by mouth daily.  30 tablet  6  . allopurinol (ZYLOPRIM) 100 MG tablet Take 2 tablets (200 mg total) by mouth daily.  60 tablet  3  . guaiFENesin (MUCINEX) 600 MG 12 hr tablet Take 600 mg by mouth 2 (two) times daily as needed.       No current facility-administered medications for this encounter.    Filed Vitals:   02/27/14 1253  BP: 150/84  Pulse: 80  Weight: 225 lb (102.059 kg)  SpO2: 99%   PHYSICAL EXAM: General:  Well appearing. No resp difficulty Wife present  HEENT: normal L  cheek scar noted. Neck: supple. JVP flat;  Carotids 2+ bilaterally; no bruits. No lymphadenopathy or thryomegaly appreciated. Cor: PMI normal. Regular rate & rhythm. No rubs or gallops. 2/6 MR Lungs: coarse bilateral bases Abdomen: obese, soft, nontender, nondistended. No hepatosplenomegaly. No bruits or masses. Good bowel sounds. Extremities: no cyanosis, clubbing, rash, edema.  Multiple tattoos bilateral upper extremities.  Neuro: alert & orientedx3, cranial nerves grossly intact. Moves all 4 extremities w/o difficulty. Affect pleasant.  ASSESSMENT & PLAN:  1. Chronic Systolic Heart Failure, NICM ECHO 07/2013 EF 40% NYHA II  - Stable from a HF perspective NYHA II. Volume status stable. Continue lasix 40 mg daily and 25 mg spironolactone. He has been out of  spironolactone and lisinopril for a few days. Can not afford meds. Re-order HF meds today.  On goal dose carvedilol 25 mg twice day On goal dose lisinopril 40 mg dialy  Continue hydralazine 75 mg tid.  Lengthy discussion regarding low salt food choices, daily weights, and low salt food choices.  Referred to HF SW for assistance with disability and medications. Refer to Paramedicine.  2. HTN- Elevated but out of HF meds. . Restart HF meds today.  -- --Cannot tolerate clonidine  3. Alcohol Abuse-Drinking  about 8  beers 4 days a week. Reinforced need for alcohol cessation.   Needs PCP. Refer to HF SW. Check BMET, dig level,  and pro bnp today   Follow up in 1 month with an ECHO.   Jeraldine Primeau D Antigone Crowell NP-C  12:56 PM

## 2014-02-27 ENCOUNTER — Ambulatory Visit (HOSPITAL_COMMUNITY)
Admission: RE | Admit: 2014-02-27 | Discharge: 2014-02-27 | Disposition: A | Discharge status: Home / Self Care | Payer: Self-pay | Source: Ambulatory Visit | Attending: Internal Medicine | Admitting: Internal Medicine

## 2014-02-27 ENCOUNTER — Encounter: Payer: Self-pay | Admitting: Licensed Clinical Social Worker

## 2014-02-27 VITALS — BP 150/84 | HR 80 | Wt 225.0 lb

## 2014-02-27 DIAGNOSIS — I10 Essential (primary) hypertension: Secondary | ICD-10-CM | POA: Insufficient documentation

## 2014-02-27 DIAGNOSIS — I5022 Chronic systolic (congestive) heart failure: Secondary | ICD-10-CM | POA: Insufficient documentation

## 2014-02-27 DIAGNOSIS — F1010 Alcohol abuse, uncomplicated: Secondary | ICD-10-CM | POA: Insufficient documentation

## 2014-02-27 DIAGNOSIS — I509 Heart failure, unspecified: Secondary | ICD-10-CM | POA: Insufficient documentation

## 2014-02-27 MED ORDER — AMLODIPINE BESYLATE 10 MG PO TABS: 10.0000 mg | ORAL_TABLET | Freq: Every day | ORAL | Status: DC

## 2014-02-27 MED ORDER — DIGOXIN 125 MCG PO TABS: 0.1250 mg | ORAL_TABLET | Freq: Every day | ORAL | Status: DC

## 2014-02-27 MED ORDER — CARVEDILOL 25 MG PO TABS: 25.0000 mg | ORAL_TABLET | Freq: Two times a day (BID) | ORAL | Status: DC

## 2014-02-27 MED ORDER — SPIRONOLACTONE 25 MG PO TABS: 25.0000 mg | ORAL_TABLET | Freq: Every day | ORAL | Status: DC

## 2014-02-27 NOTE — Progress Notes (Signed)
CSW referred to assist with resources for healthcare needs. Patient reports that he previously had medicaid and not sure why he lost it. Patient states that he has no income and no insurance. Patient states he needs a PCP and prescription plan. CSW discussed eligibility for orange card and patient very interested in application process. Patient and wife plan to go to Social Services today to explore reason for cancellation of medicaid and apply for the orange card. Patient will call CSW if further assistance needed. Lasandra Beech, LCSW [phone]

## 2014-02-27 NOTE — Patient Instructions (Signed)
Follow up in 1 month with ECHO  Do the following things EVERYDAY: 1) Weigh yourself in the morning before breakfast. Write it down and keep it in a log. 2) Take your medicines as prescribed 3) Eat low salt foods-Limit salt (sodium) to 2000 mg per day.  4) Stay as active as you can everyday 5) Limit all fluids for the day to less than 2 liters

## 2014-03-30 ENCOUNTER — Encounter (HOSPITAL_COMMUNITY): Payer: Medicaid Other

## 2014-03-30 ENCOUNTER — Ambulatory Visit (HOSPITAL_COMMUNITY): Payer: Medicaid Other | Attending: Internal Medicine

## 2014-05-09 ENCOUNTER — Encounter (HOSPITAL_COMMUNITY): Payer: Medicaid Other

## 2014-05-24 ENCOUNTER — Ambulatory Visit (HOSPITAL_COMMUNITY)
Admission: RE | Admit: 2014-05-24 | Discharge: 2014-05-24 | Disposition: A | Discharge status: Home / Self Care | Payer: Self-pay | Source: Ambulatory Visit | Attending: Internal Medicine | Admitting: Internal Medicine

## 2014-05-24 ENCOUNTER — Encounter (HOSPITAL_COMMUNITY): Payer: Self-pay | Admitting: Surgery

## 2014-05-24 ENCOUNTER — Encounter (HOSPITAL_COMMUNITY): Payer: Self-pay

## 2014-05-24 VITALS — BP 148/100 | HR 79 | Wt 239.0 lb

## 2014-05-24 DIAGNOSIS — I5022 Chronic systolic (congestive) heart failure: Secondary | ICD-10-CM | POA: Insufficient documentation

## 2014-05-24 DIAGNOSIS — I509 Heart failure, unspecified: Secondary | ICD-10-CM | POA: Insufficient documentation

## 2014-05-24 DIAGNOSIS — F1010 Alcohol abuse, uncomplicated: Secondary | ICD-10-CM | POA: Insufficient documentation

## 2014-05-24 DIAGNOSIS — I10 Essential (primary) hypertension: Secondary | ICD-10-CM | POA: Insufficient documentation

## 2014-05-24 MED ORDER — HYDRALAZINE HCL 100 MG PO TABS: 100.0000 mg | ORAL_TABLET | Freq: Three times a day (TID) | ORAL | Status: DC

## 2014-05-24 NOTE — Patient Instructions (Signed)
Follow up  In 2 months with an ECHO  Take hydralazine 100 mg three times a day  Do the following things EVERYDAY: 1) Weigh yourself in the morning before breakfast. Write it down and keep it in a log. 2) Take your medicines as prescribed 3) Eat low salt foods-Limit salt (sodium) to 2000 mg per day.  4) Stay as active as you can everyday 5) Limit all fluids for the day to less than 2 liters

## 2014-05-24 NOTE — Progress Notes (Signed)
Patient ID: Jeffrey Murray, male   DOB: [birth year], 43 y.o.   MRN: [id number] PCP: None  HPI: 43 year old gentlemen with severe untreated hypertension, ETOH use and chronic systolic heart failure due to NICM with EF 20%.   Enrolled in Guide-IT trial: usual care . Intolerant IMDUR due to headaches.   Admitted to [facility] 04/11/12 due to progressive dyspnea on exertion. Admit Pro BNP 5087. 04/12/12 ECHO EF 20%. Due to cardiogenic shock, Milrionone was initiated via PICC. Due to renal insufficiency, hydralazine and IMDUR aggressively titrated. Renal function continued to improve while on Milrinone (Creatinine 1.46>1.41>1.35>1.08>1.10>0.96> 1.03). Placed life vest prior to discharge.   Discharge Pro BNP 330. D/C weight 198 pounds.   He returns for follow up with his wife.  He missed his last appointment because he was in prison for 90 days. Says he gained weight in prison. Weight at home 238 pounds. He has been out of his meds for a couple of days. Mild dyspnea with exertion. Sleeps on 2 pillows. Has not had alcohol or an cigarettes in the last 2 months.  Not exercising. Drinking > 2 liters of fluid.   04/15/12 RHC/LHC  RA = 21 (steep y-descents)  RV = 47/8/21  PA = 45/20 (33)  PCW = 28  Fick cardiac output/index = 3.1/1.4  PVR = 1.5  FA sat = 93%  PA sat = 49%, 46%  SVR = 2037 dynes  Ao Pressure: 109/91 (100)  LV Pressure: 114/22/31  Normal coronaries. Severe NICM.   ECHO  08/01/2012 EF 40% Grade 1 diastolic dysfunction 07/12/13 ECHO EF 35-40%  CPX 08/04/13 FVC 4.06 (90%)  FEV1 3.56 (98%)  FEV1/FVC 88%  VO2 20 (62.3 % predicted), adjusted for IBW 25.1 VE/VCO2 26.6 OUES 2.73 Peak RER 0.94 VE/MVV: 37.5%  Labs:     08/04/13: K+ 3.8, Creatinine 1.3     08/11/13 K 3.9 Creatinine 1.4  11/28/13 K 4.0 Creatinine 1.56    ROS: All systems negative except as listed in HPI, PMH and Problem List.  Past Medical History  Diagnosis Date  . HTN (hypertension)     not taking medications daily  .  CHF (congestive heart failure)     Sytolic, EF 20% by echo 04/11/12    Current Outpatient Prescriptions  Medication Sig Dispense Refill  . allopurinol (ZYLOPRIM) 100 MG tablet Take 2 tablets (200 mg total) by mouth daily.  60 tablet  3  . amLODipine (NORVASC) 10 MG tablet Take 1 tablet (10 mg total) by mouth daily.  30 tablet  6  . carvedilol (COREG) 25 MG tablet Take 1 tablet (25 mg total) by mouth 2 (two) times daily with a meal.  60 tablet  3  . digoxin (LANOXIN) 0.125 MG tablet Take 1 tablet (0.125 mg total) by mouth daily.  30 tablet  6  . furosemide (LASIX) 40 MG tablet Take 1 tablet (40 mg total) by mouth 2 (two) times daily.  60 tablet  6  . hydrALAZINE (APRESOLINE) 50 MG tablet Take 1.5 tablets (75 mg total) by mouth every 8 (eight) hours.  135 tablet  6  . lisinopril (PRINIVIL,ZESTRIL) 40 MG tablet Take 40 mg by mouth daily.      Marland Kitchen spironolactone (ALDACTONE) 25 MG tablet Take 1 tablet (25 mg total) by mouth daily.  30 tablet  6  . guaiFENesin (MUCINEX) 600 MG 12 hr tablet Take 600 mg by mouth 2 (two) times daily as needed.       No current facility-administered medications  for this encounter.    Filed Vitals:   05/24/14 1144  BP: 148/100  Pulse: 79  Weight: 239 lb (108.41 kg)  SpO2: 97%   PHYSICAL EXAM: General:  Well appearing. No resp difficulty Wife present  HEENT: normal L cheek scar noted. Neck: supple. JVP flat;  Carotids 2+ bilaterally; no bruits. No lymphadenopathy or thryomegaly appreciated. Cor: PMI normal. Regular rate & rhythm. No rubs or gallops. 2/6 MR Lungs: coarse bilateral bases Abdomen: obese, soft, nontender, nondistended. No hepatosplenomegaly. No bruits or masses. Good bowel sounds. Extremities: no cyanosis, clubbing, rash, edema.  Multiple tattoos bilateral upper extremities.  Neuro: alert & orientedx3, cranial nerves grossly intact. Moves all 4 extremities w/o difficulty. Affect pleasant.  ASSESSMENT & PLAN:  1. Chronic Systolic Heart Failure,  NICM ECHO 07/2013 EF 40% NYHA II  - Stable from a HF perspective NYHA II. Volume status stable despite weight gain. Continue lasix 40 mg daily and 25 mg spironolactone. He has been out of  meds for a day. Can not afford meds. Re-order HF meds today.  On goal dose carvedilol 25 mg twice day On goal dose lisinopril 40 mg dialy  Increase hydralazine to 100 mg three times a day. lisinopril to 40 mg daily mg tid.  Lengthy discussion regarding low salt food choices, daily weights, and low salt food choices.  Referred to HF SW for assistance with disability and medications. Refer to Paramedicine.  2. HTN- Elevated but out of HF meds . Marland Kitchen. Increase hydralazine to 100 mg three times a day. Continue current dose of amlodipine, carvedilol, lisinopril and spironolactone.  -- --Cannot tolerate clonidine  3. Alcohol Abuse- Congratulated on alcohol cessation. .   Needs PCP. Refer to HF SW. Check BMET   Follow up in 2 months with an ECHO  [name] NP-C  11:51 AM

## 2014-05-31 ENCOUNTER — Telehealth: Payer: Self-pay | Admitting: Licensed Clinical Social Worker

## 2014-05-31 NOTE — Telephone Encounter (Signed)
CSW received referral to assist with medicaid. CSW contacted patient and left message for return call. Lasandra Beech, LCSW [phone]

## 2014-06-08 ENCOUNTER — Telehealth: Payer: Self-pay | Admitting: Licensed Clinical Social Worker

## 2014-06-08 NOTE — Telephone Encounter (Signed)
CSW reached patient after multiple attempts to assist with follow up on medicaid. Patient reports that is has applied for disability recently but was told that his medicaid is no longer active. He states they "told me I need to re certify". CSW instructed patient on process to re certify and provided number if further needs arise. Patient verbalizes understanding of medicaid process and where to go for certification. CSW will continue to be available as needed. Lasandra Beech, LCSW [phone]

## 2014-06-09 ENCOUNTER — Encounter: Payer: Self-pay | Admitting: Internal Medicine

## 2014-07-25 ENCOUNTER — Ambulatory Visit (HOSPITAL_COMMUNITY): Payer: Self-pay

## 2014-07-25 ENCOUNTER — Encounter (HOSPITAL_COMMUNITY): Payer: Medicaid Other

## 2014-08-16 ENCOUNTER — Ambulatory Visit (HOSPITAL_COMMUNITY)
Admission: RE | Admit: 2014-08-16 | Discharge: 2014-08-16 | Disposition: A | Discharge status: Home / Self Care | Payer: Medicaid Other | Source: Ambulatory Visit | Attending: Cardiology | Admitting: Cardiology

## 2014-08-16 ENCOUNTER — Ambulatory Visit (HOSPITAL_BASED_OUTPATIENT_CLINIC_OR_DEPARTMENT_OTHER)
Admission: RE | Admit: 2014-08-16 | Discharge: 2014-08-16 | Disposition: A | Discharge status: Home / Self Care | Payer: Self-pay | Source: Ambulatory Visit | Attending: Cardiology | Admitting: Cardiology

## 2014-08-16 ENCOUNTER — Encounter (HOSPITAL_COMMUNITY): Payer: Self-pay

## 2014-08-16 VITALS — BP 124/90 | HR 76 | Wt 247.8 lb

## 2014-08-16 DIAGNOSIS — I5022 Chronic systolic (congestive) heart failure: Secondary | ICD-10-CM

## 2014-08-16 DIAGNOSIS — J449 Chronic obstructive pulmonary disease, unspecified: Secondary | ICD-10-CM | POA: Insufficient documentation

## 2014-08-16 DIAGNOSIS — I517 Cardiomegaly: Secondary | ICD-10-CM

## 2014-08-16 DIAGNOSIS — Z79899 Other long term (current) drug therapy: Secondary | ICD-10-CM | POA: Insufficient documentation

## 2014-08-16 DIAGNOSIS — I10 Essential (primary) hypertension: Secondary | ICD-10-CM | POA: Insufficient documentation

## 2014-08-16 LAB — BASIC METABOLIC PANEL
Anion gap: 14 (ref 5–15)
BUN: 12 mg/dL (ref 6–23)
CHLORIDE: 102 meq/L (ref 96–112)
CO2: 24 mEq/L (ref 19–32)
Calcium: 9.4 mg/dL (ref 8.4–10.5)
Creatinine, Ser: 1.47 mg/dL — ABNORMAL HIGH (ref 0.50–1.35)
GFR calc Af Amer: 65 mL/min — ABNORMAL LOW (ref >90)
GFR calc non Af Amer: 56 mL/min — ABNORMAL LOW (ref >90)
GLUCOSE: 95 mg/dL (ref 70–99)
POTASSIUM: 4.2 meq/L (ref 3.7–5.3)
Sodium: 140 mEq/L (ref 137–147)

## 2014-08-16 LAB — PRO B NATRIURETIC PEPTIDE: Pro B Natriuretic peptide (BNP): 18 pg/mL (ref 0–125)

## 2014-08-16 NOTE — Progress Notes (Signed)
Echo Lab  2D Echocardiogram completed.  Cem Kosman L Cain Fitzhenry, RDCS 08/16/2014 11:13 AM

## 2014-08-16 NOTE — Patient Instructions (Signed)
Stop Digoxin  Labs today  Your physician has recommended that you have a pulmonary function test. Pulmonary Function Tests are a group of tests that measure how well air moves in and out of your lungs.  We will contact you in 4 months to schedule your next appointment.

## 2014-08-17 NOTE — Progress Notes (Signed)
Patient ID: Jeffrey Murray, male   DOB: 1970/11/18, 43 y.o.   MRN: [id number] PCP: None  HPI: 43 year old gentlemen with severe untreated hypertension, ETOH use and chronic systolic heart failure due to NICM with EF 20%.  Enrolled in [employer] trial: usual care . Intolerant IMDUR due to headaches.   Admitted to [facility] 04/11/12 due to progressive dyspnea on exertion. Admit Pro BNP 5087. 04/12/12 ECHO EF 20%. Due to cardiogenic shock, Milrionone was initiated via PICC. Due to renal insufficiency, hydralazine and IMDUR aggressively titrated. Renal function continued to improve while on Milrinone (Creatinine 1.46>1.41>1.35>1.08>1.10>0.96> 1.03). Placed life vest prior to discharge.   Discharge Pro BNP 330. D/C weight 198 pounds.   He returns for follow up with his wife.  He has been taking all his medications.  No smoking, no ETOH.  He continues to be short of breath after walking about 1 block or going up stairs.  He is short of breath with moderate housework and yardwork like sweeping.  No chest pain.  He wheezes occasionally and has an occasional cough.   I reviewed today's echo.  EF improved to 50-55% with mild LVH, normal RV size and systolic function, PA systolic pressure 33 mmHg, normal IVC.   04/15/12 RHC/LHC  RA = 21 (steep y-descents)  RV = 47/8/21  PA = 45/20 (33)  PCW = 28  Fick cardiac output/index = 3.1/1.4  PVR = 1.5  FA sat = 93%  PA sat = 49%, 46%  SVR = 2037 dynes  Ao Pressure: 109/91 (100)  LV Pressure: 114/22/31  Normal coronaries. Severe NICM.    08/01/2012 ECHO EF 40% Grade 1 diastolic dysfunction 07/12/13 ECHO EF 35-40% 10/15 ECHO EF 50-55%, mild LVH, normal RV size and systolic function, PA systolic pressure 33 mmHg, normal IVC.   CPX 08/04/13 FVC 4.06 (90%)  FEV1 3.56 (98%)  FEV1/FVC 88%  VO2 20 (62.3 % predicted), adjusted for IBW 25.1 VE/VCO2 26.6 OUES 2.73 Peak RER 0.94 VE/MVV: 37.5%  Labs:  08/04/13: K+ 3.8, Creatinine 1.3 08/11/13 K 3.9 Creatinine  1.4 11/28/13 K 4.0 Creatinine 1.56  8/15 K 3.7, creatinine 1.05  ROS: All systems negative except as listed in HPI, PMH and Problem List.  Past Medical History  Diagnosis Date  . HTN (hypertension)     not taking medications daily  . CHF (congestive heart failure)     Sytolic, EF 20% by echo 04/11/12    Current Outpatient Prescriptions  Medication Sig Dispense Refill  . amLODipine (NORVASC) 10 MG tablet Take 1 tablet (10 mg total) by mouth daily.  30 tablet  6  . carvedilol (COREG) 25 MG tablet Take 1 tablet (25 mg total) by mouth 2 (two) times daily with a meal.  60 tablet  3  . furosemide (LASIX) 40 MG tablet Take 1 tablet (40 mg total) by mouth 2 (two) times daily.  60 tablet  6  . guaiFENesin (MUCINEX) 600 MG 12 hr tablet Take 600 mg by mouth 2 (two) times daily as needed.      . hydrALAZINE (APRESOLINE) 100 MG tablet Take 1 tablet (100 mg total) by mouth 3 (three) times daily.  90 tablet  6  . lisinopril (PRINIVIL,ZESTRIL) 40 MG tablet Take 40 mg by mouth daily.      Marland Kitchen. spironolactone (ALDACTONE) 25 MG tablet Take 1 tablet (25 mg total) by mouth daily.  30 tablet  6  . allopurinol (ZYLOPRIM) 100 MG tablet Take 2 tablets (200 mg total) by mouth daily.  60 tablet  3   No current facility-administered medications for this encounter.    Filed Vitals:   08/16/14 1158  BP: 124/90  Pulse: 76  Weight: 247 lb 12.8 oz (112.401 kg)  SpO2: 97%   PHYSICAL EXAM: General:  Well appearing. No resp difficulty Wife present  HEENT: normal L cheek scar noted. Neck: supple. JVP flat;  Carotids 2+ bilaterally; no bruits. No lymphadenopathy or thryomegaly appreciated. Cor: PMI normal. Regular rate & rhythm. No rubs or gallops. No murmur.  Lungs: Somewhat distant breath sounds.  Abdomen: obese, soft, nontender, nondistended. No hepatosplenomegaly. No bruits or masses. Good bowel sounds. Extremities: no cyanosis, clubbing, rash, edema.  Multiple tattoos bilateral upper extremities.  Neuro: alert &  orientedx3, cranial nerves grossly intact. Moves all 4 extremities w/o difficulty. Affect pleasant.  ASSESSMENT & PLAN:  1. Chronic Systolic Heart Failure: Nonischemic cardiomyopathy, EF up to 50-55% on echo today with normal RV.  Still NYHA class II-III symptoms.  However, he does not appear significantly volume overloaded.  I wonder if COPD does not play a role in his dyspnea.  He was a prior heavy smoker and occasionally wheezes.  - Continue current Lasix.  - Continue hydralazine, spironolactone, Coreg, and lisinopril at current doses.  - I think that he can stop digoxin with EF back to nearly normal.  - I will get PFTs to see if there is evidence for significant COPD.  If so, could use Spiriva.  2. HTN: BP reasonably controlled on meds.  3. Alcohol Abuse: Congratulated on alcohol cessation. Marca Ancona 08/17/2014

## 2014-08-22 ENCOUNTER — Encounter (HOSPITAL_COMMUNITY): Payer: Medicaid Other

## 2014-09-28 ENCOUNTER — Encounter (HOSPITAL_COMMUNITY): Payer: Self-pay | Admitting: Internal Medicine

## 2015-04-10 ENCOUNTER — Telehealth (HOSPITAL_COMMUNITY): Payer: Self-pay | Admitting: Vascular Surgery

## 2015-04-10 NOTE — Telephone Encounter (Signed)
Pt called he wants refills on all his medications.. He has not been seen in a while. I did get him scheduled for 04/27/15.Jeffrey Murray Please call pt .Jeffrey Murray Please advise

## 2015-04-11 ENCOUNTER — Other Ambulatory Visit (HOSPITAL_COMMUNITY): Payer: Self-pay

## 2015-04-11 MED ORDER — LISINOPRIL 40 MG PO TABS: 40.0000 mg | ORAL_TABLET | Freq: Every day | ORAL | Status: AC

## 2015-04-11 MED ORDER — FUROSEMIDE 40 MG PO TABS: 40.0000 mg | ORAL_TABLET | Freq: Two times a day (BID) | ORAL | Status: DC

## 2015-04-11 MED ORDER — AMLODIPINE BESYLATE 10 MG PO TABS: 10.0000 mg | ORAL_TABLET | Freq: Every day | ORAL | Status: AC

## 2015-04-11 MED ORDER — CARVEDILOL 25 MG PO TABS: 25.0000 mg | ORAL_TABLET | Freq: Two times a day (BID) | ORAL | Status: AC

## 2015-04-11 MED ORDER — ALLOPURINOL 100 MG PO TABS: 200.0000 mg | ORAL_TABLET | Freq: Every day | ORAL | Status: AC

## 2015-04-11 MED ORDER — HYDRALAZINE HCL 100 MG PO TABS: 100.0000 mg | ORAL_TABLET | Freq: Three times a day (TID) | ORAL | Status: DC

## 2015-04-11 MED ORDER — SPIRONOLACTONE 25 MG PO TABS: 25.0000 mg | ORAL_TABLET | Freq: Every day | ORAL | Status: DC

## 2015-04-11 NOTE — Telephone Encounter (Signed)
Refills sent in for 30 day supply, no refills remaining.  Patient made aware that he is not to get any further refills until seen in our clinic.  Made an Appointment for next month.  Stressed that he must show up to this appointment for any more refills.  Advised to bring all current pill bottles with him to his appointment as we have not seen him in awhile.  Ave Filter

## 2015-04-13 ENCOUNTER — Encounter (HOSPITAL_COMMUNITY): Payer: Self-pay | Admitting: *Deleted

## 2015-04-27 ENCOUNTER — Encounter (HOSPITAL_COMMUNITY): Payer: Self-pay

## 2015-05-21 ENCOUNTER — Inpatient Hospital Stay (HOSPITAL_COMMUNITY): Admission: RE | Admit: 2015-05-21 | Payer: Self-pay | Source: Ambulatory Visit

## 2015-07-02 ENCOUNTER — Telehealth (HOSPITAL_COMMUNITY): Payer: Self-pay | Admitting: Cardiology

## 2015-07-02 NOTE — Telephone Encounter (Signed)
pts wife called to inform CHF team, patient currently in the hospital in [facility] for fluid overload/chf Advised to have hospital fax discharge summary once discharge date confirmed and also may want to call for hosp follow up shortly after being discharge to ensure fluid is not returning  pts wife voiced understanding

## 2015-10-24 ENCOUNTER — Encounter (HOSPITAL_COMMUNITY): Payer: Self-pay | Admitting: *Deleted

## 2015-10-24 ENCOUNTER — Telehealth (HOSPITAL_COMMUNITY): Payer: Self-pay | Admitting: *Deleted

## 2015-10-24 NOTE — Telephone Encounter (Signed)
Pt's wife called to let us know pt is incarcerated and has been for a few months now due to child support.  She states he had previously given them a letter just stating he is a patient under our care and she needs a new one to give to his attorney.  Letter completed for today and emailed along with letter from June to her at [email or web address], advised when pt is release we need to see him in the office since we have not seen him since Oct 2015 she is agreeable

## 2016-01-29 ENCOUNTER — Encounter (HOSPITAL_COMMUNITY): Payer: Self-pay

## 2016-02-12 ENCOUNTER — Telehealth (HOSPITAL_COMMUNITY): Payer: Self-pay

## 2016-02-12 NOTE — Telephone Encounter (Signed)
2nd disability claim forwarded to our office (CHF [facility]) for completion. Patient has continued to no show his appointments and is aware that he must complete an appointment before our office/providers can complete any paperwork for claims on his behalf. Left VM for patient to try to set up another apt as he recently no showed an apt last week. Paperwork will remain on file until patient shows for apt. Return fax made to provided # [phone] with above notations.  Ave Filter

## 2017-11-20 ENCOUNTER — Ambulatory Visit (HOSPITAL_COMMUNITY)
Admission: RE | Admit: 2017-11-20 | Discharge: 2017-11-20 | Disposition: A | Discharge status: Home / Self Care | Payer: Self-pay | Source: Ambulatory Visit | Attending: Cardiology | Admitting: Cardiology

## 2017-11-20 ENCOUNTER — Encounter (HOSPITAL_COMMUNITY): Payer: Self-pay

## 2017-11-20 ENCOUNTER — Telehealth (HOSPITAL_COMMUNITY): Payer: Self-pay

## 2017-11-20 VITALS — BP 122/84 | HR 85 | Wt 233.0 lb

## 2017-11-20 DIAGNOSIS — I10 Essential (primary) hypertension: Secondary | ICD-10-CM

## 2017-11-20 DIAGNOSIS — I5022 Chronic systolic (congestive) heart failure: Secondary | ICD-10-CM | POA: Insufficient documentation

## 2017-11-20 DIAGNOSIS — R0602 Shortness of breath: Secondary | ICD-10-CM | POA: Insufficient documentation

## 2017-11-20 DIAGNOSIS — N183 Chronic kidney disease, stage 3 unspecified: Secondary | ICD-10-CM

## 2017-11-20 DIAGNOSIS — I429 Cardiomyopathy, unspecified: Secondary | ICD-10-CM | POA: Insufficient documentation

## 2017-11-20 DIAGNOSIS — I130 Hypertensive heart and chronic kidney disease with heart failure and stage 1 through stage 4 chronic kidney disease, or unspecified chronic kidney disease: Secondary | ICD-10-CM | POA: Insufficient documentation

## 2017-11-20 DIAGNOSIS — Z79899 Other long term (current) drug therapy: Secondary | ICD-10-CM | POA: Insufficient documentation

## 2017-11-20 DIAGNOSIS — F1010 Alcohol abuse, uncomplicated: Secondary | ICD-10-CM | POA: Insufficient documentation

## 2017-11-20 LAB — BASIC METABOLIC PANEL
ANION GAP: 12 (ref 5–15)
BUN: 17 mg/dL (ref 6–20)
CO2: 26 mmol/L (ref 22–32)
Calcium: 8.6 mg/dL — ABNORMAL LOW (ref 8.9–10.3)
Chloride: 99 mmol/L — ABNORMAL LOW (ref 101–111)
Creatinine, Ser: 1.92 mg/dL — ABNORMAL HIGH (ref 0.61–1.24)
GFR calc Af Amer: 46 mL/min — ABNORMAL LOW (ref >60)
GFR, EST NON AFRICAN AMERICAN: 40 mL/min — AB (ref >60)
GLUCOSE: 91 mg/dL (ref 65–99)
Potassium: 3.3 mmol/L — ABNORMAL LOW (ref 3.5–5.1)
SODIUM: 137 mmol/L (ref 135–145)

## 2017-11-20 LAB — BRAIN NATRIURETIC PEPTIDE: B NATRIURETIC PEPTIDE 5: 2531.8 pg/mL — AB (ref 0.0–100.0)

## 2017-11-20 MED ORDER — FUROSEMIDE 40 MG PO TABS: 40.0000 mg | ORAL_TABLET | Freq: Two times a day (BID) | ORAL | 2 refills | Status: AC

## 2017-11-20 MED FILL — FUROSEMIDE 40 MG TAB: 40 | 30 days supply | Qty: 60 | Fill #0

## 2017-11-20 NOTE — Patient Instructions (Signed)
TAKE Lasix 40 mg tablet twice daily. Prescription has been sent to [facility] under the [facility]. Address: [address] [place], [place] [address]  Phone: [phone] Located next to [facility].  Routine lab work today. Will notify you of abnormal results, otherwise no news is good news!  Follow up 2 weeks with echocardiogram and appointment with Amy Clegg NP-C.  Will refer you to Lasandra Beech CHF social worker for PCP.  Take all medication as prescribed the day of your appointment. Bring all medications with you to your appointment.  Do the following things EVERYDAY: 1) Weigh yourself in the morning before breakfast. Write it down and keep it in a log. 2) Take your medicines as prescribed 3) Eat low salt foods-Limit salt (sodium) to 2000 mg per day.  4) Stay as active as you can everyday 5) Limit all fluids for the day to less than 2 liters

## 2017-11-20 NOTE — Progress Notes (Signed)
Patient ID: Jeffrey Murray, male   DOB: 22-Apr-1971, 47 y.o.   MRN: [id number] PCP: None   HPI: 47 year old gentlemen with severe untreated hypertension, ETOH use and chronic systolic heart failure due to NICM with EF 20%.  Intolerant IMDUR due to headaches.   Admitted to [facility] 04/11/12 due to progressive dyspnea on exertion. Admit Pro BNP 5087. 04/12/12 ECHO EF 20%. Due to cardiogenic shock, Milrionone was initiated via PICC. Due to renal insufficiency, hydralazine and IMDUR aggressively titrated. Renal function continued to improve while on Milrinone (Creatinine 1.46>1.41>1.35>1.08>1.10>0.96> 1.03). Placed life vest prior to discharge.     Today he returns for HF follow up. He has not been seen in over 2 years.  He has been living in [facility] but plans to move back. He has been to the ED multiple times in [facility] for what sounds like A/C systolic heart failure. Says his EF is down to 20%. SOB with exertion. + bendopnea. Denies PND. Drinking lots of fluid. Eating whatever he wants. He has been out of lasix 2 days ago.  Drinks 1 beer a  Week. Smoking marijuana daily.    04/15/12 RHC/LHC  RA = 21 (steep y-descents)  RV = 47/8/21  PA = 45/20 (33)  PCW = 28  Fick cardiac output/index = 3.1/1.4  PVR = 1.5  FA sat = 93%  PA sat = 49%, 46%  SVR = 2037 dynes  Ao Pressure: 109/91 (100)  LV Pressure: 114/22/31  Normal coronaries. Severe NICM.    08/01/2012 ECHO EF 40% Grade 1 diastolic dysfunction 07/12/13 ECHO EF 35-40% 10/15 ECHO EF 50-55%, mild LVH, normal RV size and systolic function, PA systolic pressure 33 mmHg, normal IVC.   CPX 08/04/13 FVC 4.06 (90%)  FEV1 3.56 (98%)  FEV1/FVC 88%  VO2 20 (62.3 % predicted), adjusted for IBW 25.1 VE/VCO2 26.6 OUES 2.73 Peak RER 0.94 VE/MVV: 37.5%  Labs:  08/04/13: K+ 3.8, Creatinine 1.3 08/11/13 K 3.9 Creatinine 1.4 11/28/13 K 4.0 Creatinine 1.56  8/15 K 3.7, creatinine 1.05  ROS: All systems negative except as listed in HPI, PMH and Problem List.  Past  Medical History:  Diagnosis Date  . CHF (congestive heart failure) (HCC)    Sytolic, EF 20% by echo 04/11/12  . HTN (hypertension)    not taking medications daily    Current Outpatient Medications  Medication Sig Dispense Refill  . allopurinol (ZYLOPRIM) 100 MG tablet Take 2 tablets (200 mg total) by mouth daily. No further refills until seen in clinic. 60 tablet 0  . amLODipine (NORVASC) 10 MG tablet Take 1 tablet (10 mg total) by mouth daily. No further refills until seen in clinic. 30 tablet 0  . carvedilol (COREG) 25 MG tablet Take 1 tablet (25 mg total) by mouth 2 (two) times daily with a meal. No further refills until seen in clinic. 60 tablet 0  . furosemide (LASIX) 40 MG tablet Take 1 tablet (40 mg total) by mouth 2 (two) times daily. No further refills until seen in clinic. 60 tablet 0  . guaiFENesin (MUCINEX) 600 MG 12 hr tablet Take 600 mg by mouth 2 (two) times daily as needed.    Marland Kitchen lisinopril (PRINIVIL,ZESTRIL) 40 MG tablet Take 1 tablet (40 mg total) by mouth daily. No further refills until seen in clinic. 30 tablet 0   No current facility-administered medications for this encounter.     Vitals:   11/20/17 0944  BP: 122/84  Pulse: 85  SpO2: 98%  Weight: 233 lb (  105.7 kg)   PHYSICAL EXAM: General:  Walked slowly in the clinic. No resp difficulty HEENT: normal Neck: supple. JVP to jaw. Carotids 2+ bilat; no bruits. No lymphadenopathy or thryomegaly appreciated. Cor: PMI nondisplaced. Regular rate & rhythm. No rubs, gallops or murmurs. Lungs: EW thourghout Abdomen: soft, nontender, distended. No hepatosplenomegaly. No bruits or masses. Good bowel sounds. Extremities: no cyanosis, clubbing, rash, R and LLE 2+ edema Neuro: alert & orientedx3, cranial nerves grossly intact. moves all 4 extremities w/o difficulty. Affect pleasant  ASSESSMENT & PLAN:  1. Chronic Systolic Heart Failure: Nonischemic cardiomyopathy, 07/2014  50-55% on echo today with normal RV. SOB with  exertion. Set up for repeat ECHO.  Continue carvedilol 25 mg twice a day.  Increase lasix to 40 mg twice a day.  For now continue lisinopril 40 mg daily. If EF is down will switch to entresto.  2. HTN: Stable today Restart medications.  3. Alcohol Abuse:  Encouraged to stop drinking alchol all together.  4. CKD Stage III- check BMET today.   Today HF medications sent to HF pharmacy. Reinforced low salt diet, low food choices, and medication changes. Refer him to [facility] for PCP.   Follow up in 2 weeks.  Greater than 50% of the (total minutes 25*) visit spent in counseling/coordination of care regarding the above.     Aseneth Hack NP-C  11/20/2017

## 2017-11-20 NOTE — Telephone Encounter (Signed)
Result Notes for B Nat Peptide   Notes recorded by Chyrl Civatte, RN on 11/20/2017 at 2:15 PM EST LVMTCB ------  Notes recorded by Sherald Hess, NP on 11/20/2017 at 12:23 PM EST Set lab draw ------  Notes recorded by Tonye Becket D, NP on 11/20/2017 at 12:23 PM EST Repeat BMET next week.

## 2017-11-20 NOTE — Telephone Encounter (Signed)
Result Notes for B Nat Peptide   Notes recorded by Chyrl Civatte, RN on 11/20/2017 at 2:24 PM EST Patient aware and agreeable, repeat bmet scheduled for next Thursday 2/7 ------  Notes recorded by Chyrl Civatte, RN on 11/20/2017 at 2:15 PM EST LVMTCB ------  Notes recorded by Sherald Hess, NP on 11/20/2017 at 12:23 PM EST Set lab draw ------  Notes recorded by Tonye Becket D, NP on 11/20/2017 at 12:23 PM EST Repeat BMET next week.

## 2017-11-26 ENCOUNTER — Other Ambulatory Visit (HOSPITAL_COMMUNITY): Payer: Self-pay

## 2017-12-04 ENCOUNTER — Ambulatory Visit (HOSPITAL_COMMUNITY): Admission: RE | Admit: 2017-12-04 | Payer: Self-pay | Source: Ambulatory Visit

## 2017-12-04 ENCOUNTER — Encounter (HOSPITAL_COMMUNITY): Payer: Self-pay
